# Patient Record
Sex: Female | Born: 1940 | Race: White | Hispanic: No | Marital: Married | Smoking: Former smoker
Health system: Southern US, Community
[De-identification: ages and names within clinical notes are randomized; demographics above are authoritative.]

## PROBLEM LIST (undated history)

## (undated) DIAGNOSIS — I10 Essential (primary) hypertension: Secondary | ICD-10-CM

## (undated) DIAGNOSIS — R911 Solitary pulmonary nodule: Secondary | ICD-10-CM

## (undated) DIAGNOSIS — E039 Hypothyroidism, unspecified: Secondary | ICD-10-CM

## (undated) DIAGNOSIS — E785 Hyperlipidemia, unspecified: Secondary | ICD-10-CM

## (undated) HISTORY — PX: CARDIAC CATHETERIZATION: SHX172

## (undated) HISTORY — PX: CATARACT EXTRACTION: SUR2

## (undated) HISTORY — DX: Solitary pulmonary nodule: R91.1

---

## 1972-04-27 HISTORY — PX: TUBAL LIGATION: SHX77

## 1986-04-27 HISTORY — PX: ABDOMINAL HYSTERECTOMY: SHX81

## 1988-04-27 HISTORY — PX: CHOLECYSTECTOMY: SHX55

## 2002-03-07 ENCOUNTER — Other Ambulatory Visit: Admission: RE | Admit: 2002-03-07 | Discharge: 2002-03-07 | Payer: Self-pay | Admitting: Obstetrics and Gynecology

## 2003-05-22 ENCOUNTER — Encounter: Admission: RE | Admit: 2003-05-22 | Discharge: 2003-05-22 | Payer: Self-pay | Admitting: Emergency Medicine

## 2005-02-23 ENCOUNTER — Ambulatory Visit: Payer: Self-pay | Admitting: Gastroenterology

## 2005-05-08 ENCOUNTER — Encounter: Admission: RE | Admit: 2005-05-08 | Discharge: 2005-05-08 | Payer: Self-pay | Admitting: Emergency Medicine

## 2005-05-14 ENCOUNTER — Encounter: Admission: RE | Admit: 2005-05-14 | Discharge: 2005-05-14 | Payer: Self-pay | Admitting: Emergency Medicine

## 2005-05-22 ENCOUNTER — Ambulatory Visit (HOSPITAL_COMMUNITY): Admission: RE | Admit: 2005-05-22 | Discharge: 2005-05-22 | Payer: Self-pay | Admitting: Emergency Medicine

## 2007-08-05 ENCOUNTER — Encounter: Admission: RE | Admit: 2007-08-05 | Discharge: 2007-08-05 | Payer: Self-pay | Admitting: Emergency Medicine

## 2011-08-05 DIAGNOSIS — I10 Essential (primary) hypertension: Secondary | ICD-10-CM | POA: Diagnosis not present

## 2011-08-05 DIAGNOSIS — E559 Vitamin D deficiency, unspecified: Secondary | ICD-10-CM | POA: Diagnosis not present

## 2011-08-05 DIAGNOSIS — E039 Hypothyroidism, unspecified: Secondary | ICD-10-CM | POA: Diagnosis not present

## 2011-08-05 DIAGNOSIS — E119 Type 2 diabetes mellitus without complications: Secondary | ICD-10-CM | POA: Diagnosis not present

## 2011-08-05 DIAGNOSIS — R252 Cramp and spasm: Secondary | ICD-10-CM | POA: Diagnosis not present

## 2011-08-05 DIAGNOSIS — E785 Hyperlipidemia, unspecified: Secondary | ICD-10-CM | POA: Diagnosis not present

## 2011-08-25 DIAGNOSIS — H25099 Other age-related incipient cataract, unspecified eye: Secondary | ICD-10-CM | POA: Diagnosis not present

## 2011-09-09 DIAGNOSIS — H2510 Age-related nuclear cataract, unspecified eye: Secondary | ICD-10-CM | POA: Diagnosis not present

## 2011-09-10 ENCOUNTER — Encounter (HOSPITAL_COMMUNITY): Payer: Self-pay | Admitting: Physical Medicine and Rehabilitation

## 2011-09-10 ENCOUNTER — Emergency Department (HOSPITAL_COMMUNITY)
Admission: EM | Admit: 2011-09-10 | Discharge: 2011-09-10 | Disposition: A | Discharge status: Home / Self Care | Payer: Medicare Other | Attending: Emergency Medicine | Admitting: Emergency Medicine

## 2011-09-10 DIAGNOSIS — M79609 Pain in unspecified limb: Secondary | ICD-10-CM | POA: Diagnosis not present

## 2011-09-10 DIAGNOSIS — E039 Hypothyroidism, unspecified: Secondary | ICD-10-CM | POA: Diagnosis not present

## 2011-09-10 DIAGNOSIS — R252 Cramp and spasm: Secondary | ICD-10-CM | POA: Diagnosis not present

## 2011-09-10 DIAGNOSIS — M7989 Other specified soft tissue disorders: Secondary | ICD-10-CM

## 2011-09-10 DIAGNOSIS — M545 Low back pain: Secondary | ICD-10-CM | POA: Diagnosis not present

## 2011-09-10 DIAGNOSIS — Z79899 Other long term (current) drug therapy: Secondary | ICD-10-CM | POA: Insufficient documentation

## 2011-09-10 DIAGNOSIS — I10 Essential (primary) hypertension: Secondary | ICD-10-CM | POA: Insufficient documentation

## 2011-09-10 DIAGNOSIS — E119 Type 2 diabetes mellitus without complications: Secondary | ICD-10-CM | POA: Insufficient documentation

## 2011-09-10 DIAGNOSIS — E785 Hyperlipidemia, unspecified: Secondary | ICD-10-CM | POA: Diagnosis not present

## 2011-09-10 HISTORY — DX: Hypothyroidism, unspecified: E03.9

## 2011-09-10 HISTORY — DX: Essential (primary) hypertension: I10

## 2011-09-10 HISTORY — DX: Hyperlipidemia, unspecified: E78.5

## 2011-09-10 LAB — URINALYSIS, ROUTINE W REFLEX MICROSCOPIC
Bilirubin Urine: NEGATIVE
Glucose, UA: NEGATIVE mg/dL
Ketones, ur: NEGATIVE mg/dL
Nitrite: NEGATIVE
Specific Gravity, Urine: 1.009 (ref 1.005–1.030)
pH: 6 (ref 5.0–8.0)

## 2011-09-10 LAB — DIFFERENTIAL
Lymphocytes Relative: 29 % (ref 12–46)
Lymphs Abs: 2 10*3/uL (ref 0.7–4.0)
Monocytes Absolute: 0.6 10*3/uL (ref 0.1–1.0)
Monocytes Relative: 8 % (ref 3–12)
Neutro Abs: 4.2 10*3/uL (ref 1.7–7.7)
Neutrophils Relative %: 61 % (ref 43–77)

## 2011-09-10 LAB — BASIC METABOLIC PANEL
BUN: 17 mg/dL (ref 6–23)
CO2: 27 mEq/L (ref 19–32)
Chloride: 100 mEq/L (ref 96–112)
Creatinine, Ser: 0.95 mg/dL (ref 0.50–1.10)
Potassium: 3.6 mEq/L (ref 3.5–5.1)

## 2011-09-10 LAB — VANCOMYCIN, RANDOM: Vancomycin Rm: <5 ug/mL

## 2011-09-10 LAB — URINE MICROSCOPIC-ADD ON

## 2011-09-10 LAB — CBC
HCT: 41.5 % (ref 36.0–46.0)
Hemoglobin: 14.4 g/dL (ref 12.0–15.0)
RBC: 4.83 MIL/uL (ref 3.87–5.11)
WBC: 7 10*3/uL (ref 4.0–10.5)

## 2011-09-10 MED ORDER — IBUPROFEN 800 MG PO TABS
800.0000 mg | ORAL_TABLET | Freq: Once | ORAL | Status: AC
  Administered 2011-09-10: 800 mg via ORAL
  Filled 2011-09-10: qty 1

## 2011-09-10 MED ORDER — NAPROXEN 500 MG PO TABS: 500.0000 mg | ORAL_TABLET | Freq: Two times a day (BID) | ORAL | Status: AC

## 2011-09-10 NOTE — ED Notes (Signed)
Pt ambulated to bathroom without any difficultly.

## 2011-09-10 NOTE — Discharge Instructions (Signed)
Leg Cramps  Leg cramps that occur during exercise can be caused by poor circulation or dehydration. However, muscle cramps that occur at rest or during the night are usually not due to any serious medical problem. Heat cramps may cause muscle spasms during hot weather.   CAUSES  There is no clear cause for muscle cramps. However, dehydration may be a factor for those who do not drink enough fluids and those who exercise in the heat. Imbalances in the level of sodium, potassium, calcium or magnesium in the muscle tissue may also be a factor. Some medications, such as water pills (diuretics), may cause loss of chemicals that the body needs (like sodium and potassium) and cause muscle cramps.  TREATMENT   · Make sure your diet has enough fluids and essential minerals for the muscle to work normally.  · Avoid strenuous exercise for several days if you have been having frequent leg cramps.  · Stretch and massage the cramped muscle for several minutes.  · Some medicines may be helpful in some patients with night cramps. Only take over-the-counter or prescription medicines as directed by your caregiver.  SEEK IMMEDIATE MEDICAL CARE IF:   · Your leg cramps become worse.  · Your foot becomes cold, numb, or blue.  Document Released: 05/21/2004 Document Revised: 04/02/2011 Document Reviewed: 05/08/2008  Exit[employer]® Patient Information ©2012 [employer].

## 2011-09-10 NOTE — Progress Notes (Signed)
Left lower extremity venous duplex completed.  Preliminary report is negative for DVT, SVT, or a Baker's cyst in the left leg.  Negative for DVT in the right common femoral vein. 

## 2011-09-10 NOTE — ED Notes (Signed)
Pt presents with onset of "cramping" to L thigh that radiates down to her L foot.  Pt reports her husband has been admitted upstairs, she has had no sleep.  Pt also reports x 2-3 days, she has noted dark-colored urine with strong odor.

## 2011-09-10 NOTE — ED Provider Notes (Signed)
History   This chart was scribed for Glynn Octave, MD by Charolett Bumpers . The patient was seen in room STRE7/STRE7.    CSN: [id number]  Arrival date & time 09/10/11  1245   None     Chief Complaint  Patient presents with  . Leg Pain    (Consider location/radiation/quality/duration/timing/severity/associated sxs/prior treatment) HPI Victoria Ferguson is a 71 y.o. female who presents to the Emergency Department complaining of constant, moderate left leg pain that starts in hip that started this morning. Patient describes leg pain as cramping and rates leg pain as a 5/10. Patient states that the leg pain is worsening with weight bearing. Patient denies any recent falls or injuries. Patient denies any numbness/weakness or tingling of extremities. Patient denies any incontinence. Patient also reports associated lower back pain. Patient states that she has taken Advil with some relief. Patient reports a h/o leg pain and cramping in her joints. Patient denies any recent traveling.    Past Medical History  Diagnosis Date  . Hypertension   . Hyperlipemia   . Diabetes mellitus   . Hypothyroidism     No past surgical history on file.  No family history on file.  History  Substance Use Topics  . Smoking status: Never Smoker   . Smokeless tobacco: Not on file  . Alcohol Use: No    OB History    Grav Para Term Preterm Abortions TAB SAB Ect Mult Living                  Review of Systems  Constitutional: Negative for fever.  Gastrointestinal: Negative for vomiting and abdominal pain.  Musculoskeletal: Positive for back pain.       Left leg pain.   All other systems reviewed and are negative.    Allergies  Iohexol  Home Medications   Current Outpatient Rx  Name Route Sig Dispense Refill  . ASPIRIN 325 MG PO TABS Oral Take 325 mg by mouth every 6 (six) hours as needed. For pain    . HYDROCHLOROTHIAZIDE 25 MG PO TABS Oral Take 25 mg by mouth daily.    . IBUPROFEN  200 MG PO TABS Oral Take 200 mg by mouth as needed. For pain    . LEVOTHYROXINE SODIUM 88 MCG PO TABS Oral Take 88 mcg by mouth daily.    . ADULT MULTIVITAMIN W/MINERALS CH Oral Take 1 tablet by mouth daily.    Marland Kitchen POTASSIUM CHLORIDE CRYS ER 20 MEQ PO TBCR Oral Take 20 mEq by mouth daily.    Marland Kitchen SIMVASTATIN 20 MG PO TABS Oral Take 20 mg by mouth every evening.    Marland Kitchen VALSARTAN 160 MG PO TABS Oral Take 160 mg by mouth daily.    Marland Kitchen VITAMIN D (ERGOCALCIFEROL) 50000 UNITS PO CAPS Oral Take 50,000 Units by mouth every 7 (seven) days. Take on sundays      BP 148/67  Pulse 101  Temp(Src) 98.7 F (37.1 C) (Oral)  Resp 18  SpO2 96%  Physical Exam  Nursing note and vitals reviewed. Constitutional: She is oriented to person, place, and time. She appears well-developed and well-nourished. No distress.  HENT:  Head: Normocephalic and atraumatic.  Eyes: EOM are normal.  Neck: Neck supple. No tracheal deviation present.  Cardiovascular: Normal rate, regular rhythm and normal heart sounds.   Pulmonary/Chest: Effort normal and breath sounds normal. No respiratory distress.  Musculoskeletal: Normal range of motion.       Strength 5/5 bilaterally in lower extremities. +  2 DP and TP pulses. No calf assymmetry. No swelling. Able to extend great toes bilaterally. Left perispinal lumbar tenderness.   Neurological: She is alert and oriented to person, place, and time.  Skin: Skin is warm and dry.       Lower extremities are warm and well perfused.   Psychiatric: She has a normal mood and affect. Her behavior is normal.    ED Course  Procedures (including critical care time)  DIAGNOSTIC STUDIES: Oxygen Saturation is 96% on room air, normal by my interpretation.    COORDINATION OF CARE:  1350: Discussed planned course of treatment with the patient, who is agreeable at this time.  1532: Recheck: Reevaluation of patient. Discussed lab results.    Labs Reviewed  CBC - Abnormal; Notable for the following:      Platelets 141 (*)    All other components within normal limits  BASIC METABOLIC PANEL - Abnormal; Notable for the following:    GFR calc non Af Amer 59 (*)    GFR calc Af Amer 69 (*)    All other components within normal limits  URINALYSIS, ROUTINE W REFLEX MICROSCOPIC - Abnormal; Notable for the following:    Leukocytes, UA SMALL (*)    All other components within normal limits  DIFFERENTIAL  CK  VANCOMYCIN, RANDOM  URINE MICROSCOPIC-ADD ON   No results found.   No diagnosis found.    MDM  Left leg pain without history of trauma. Feels cramping sensation and posterior thigh, calf extending to ankle. No weakness, numbness, tingling, bowel or bladder incontinence, fever or chills or vomiting. Has had similar cramping in the past that improves with NSAIDs.  Neurovascular intact, warm well-perfused, easily palpable distal pulses  Doppler negative for DVT. CK within normal limits. No chemistry abnormalities. Pain improved, and ambulatory without assistance.  I personally performed the services described in this documentation, which was scribed in my presence.  The recorded information has been reviewed and considered.       Glynn Octave, MD 09/10/11 [phone]

## 2011-09-10 NOTE — ED Notes (Signed)
Pt presents to department for evaluation of L leg pain. Onset this morning. States pain to L thigh area radiating down leg to foot. Pedal pulses present. Able to wiggle digits. 5/10 pain at the time, becomes worse with weight bearing. She is conscious alert and oriented x4. No signs of acute distress noted at the time. Daughter at bedside.

## 2011-09-14 DIAGNOSIS — H2510 Age-related nuclear cataract, unspecified eye: Secondary | ICD-10-CM | POA: Diagnosis not present

## 2011-09-14 DIAGNOSIS — IMO0002 Reserved for concepts with insufficient information to code with codable children: Secondary | ICD-10-CM | POA: Diagnosis not present

## 2011-09-29 DIAGNOSIS — H2510 Age-related nuclear cataract, unspecified eye: Secondary | ICD-10-CM | POA: Diagnosis not present

## 2011-09-30 DIAGNOSIS — IMO0002 Reserved for concepts with insufficient information to code with codable children: Secondary | ICD-10-CM | POA: Diagnosis not present

## 2011-09-30 DIAGNOSIS — H2510 Age-related nuclear cataract, unspecified eye: Secondary | ICD-10-CM | POA: Diagnosis not present

## 2011-12-22 DIAGNOSIS — E559 Vitamin D deficiency, unspecified: Secondary | ICD-10-CM | POA: Diagnosis not present

## 2011-12-22 DIAGNOSIS — N183 Chronic kidney disease, stage 3 unspecified: Secondary | ICD-10-CM | POA: Diagnosis not present

## 2012-03-29 DIAGNOSIS — E559 Vitamin D deficiency, unspecified: Secondary | ICD-10-CM | POA: Diagnosis not present

## 2012-03-29 DIAGNOSIS — R413 Other amnesia: Secondary | ICD-10-CM | POA: Diagnosis not present

## 2012-03-29 DIAGNOSIS — Z23 Encounter for immunization: Secondary | ICD-10-CM | POA: Diagnosis not present

## 2012-03-29 DIAGNOSIS — E039 Hypothyroidism, unspecified: Secondary | ICD-10-CM | POA: Diagnosis not present

## 2012-03-29 DIAGNOSIS — E119 Type 2 diabetes mellitus without complications: Secondary | ICD-10-CM | POA: Diagnosis not present

## 2012-03-29 DIAGNOSIS — Z0000 Encounter for general adult medical examination without abnormal findings: Secondary | ICD-10-CM | POA: Diagnosis not present

## 2012-03-29 DIAGNOSIS — E785 Hyperlipidemia, unspecified: Secondary | ICD-10-CM | POA: Diagnosis not present

## 2012-03-29 DIAGNOSIS — N183 Chronic kidney disease, stage 3 unspecified: Secondary | ICD-10-CM | POA: Diagnosis not present

## 2012-04-28 DIAGNOSIS — M810 Age-related osteoporosis without current pathological fracture: Secondary | ICD-10-CM | POA: Diagnosis not present

## 2012-04-28 DIAGNOSIS — E8941 Symptomatic postprocedural ovarian failure: Secondary | ICD-10-CM | POA: Diagnosis not present

## 2012-05-02 DIAGNOSIS — I10 Essential (primary) hypertension: Secondary | ICD-10-CM | POA: Diagnosis not present

## 2012-06-01 DIAGNOSIS — M810 Age-related osteoporosis without current pathological fracture: Secondary | ICD-10-CM | POA: Diagnosis not present

## 2012-08-10 DIAGNOSIS — M48061 Spinal stenosis, lumbar region without neurogenic claudication: Secondary | ICD-10-CM | POA: Diagnosis not present

## 2012-08-18 DIAGNOSIS — M47817 Spondylosis without myelopathy or radiculopathy, lumbosacral region: Secondary | ICD-10-CM | POA: Diagnosis not present

## 2012-08-24 DIAGNOSIS — M47817 Spondylosis without myelopathy or radiculopathy, lumbosacral region: Secondary | ICD-10-CM | POA: Diagnosis not present

## 2012-09-01 DIAGNOSIS — M48061 Spinal stenosis, lumbar region without neurogenic claudication: Secondary | ICD-10-CM | POA: Diagnosis not present

## 2012-09-01 DIAGNOSIS — M47817 Spondylosis without myelopathy or radiculopathy, lumbosacral region: Secondary | ICD-10-CM | POA: Diagnosis not present

## 2012-09-06 DIAGNOSIS — M48061 Spinal stenosis, lumbar region without neurogenic claudication: Secondary | ICD-10-CM | POA: Diagnosis not present

## 2012-09-06 DIAGNOSIS — M47817 Spondylosis without myelopathy or radiculopathy, lumbosacral region: Secondary | ICD-10-CM | POA: Diagnosis not present

## 2012-09-09 DIAGNOSIS — M47817 Spondylosis without myelopathy or radiculopathy, lumbosacral region: Secondary | ICD-10-CM | POA: Diagnosis not present

## 2012-09-09 DIAGNOSIS — M48061 Spinal stenosis, lumbar region without neurogenic claudication: Secondary | ICD-10-CM | POA: Diagnosis not present

## 2012-09-12 DIAGNOSIS — M47817 Spondylosis without myelopathy or radiculopathy, lumbosacral region: Secondary | ICD-10-CM | POA: Diagnosis not present

## 2012-09-12 DIAGNOSIS — M48061 Spinal stenosis, lumbar region without neurogenic claudication: Secondary | ICD-10-CM | POA: Diagnosis not present

## 2012-09-14 DIAGNOSIS — M47817 Spondylosis without myelopathy or radiculopathy, lumbosacral region: Secondary | ICD-10-CM | POA: Diagnosis not present

## 2012-09-14 DIAGNOSIS — M48061 Spinal stenosis, lumbar region without neurogenic claudication: Secondary | ICD-10-CM | POA: Diagnosis not present

## 2012-09-20 DIAGNOSIS — M47817 Spondylosis without myelopathy or radiculopathy, lumbosacral region: Secondary | ICD-10-CM | POA: Diagnosis not present

## 2012-09-20 DIAGNOSIS — M48061 Spinal stenosis, lumbar region without neurogenic claudication: Secondary | ICD-10-CM | POA: Diagnosis not present

## 2012-09-26 DIAGNOSIS — M48061 Spinal stenosis, lumbar region without neurogenic claudication: Secondary | ICD-10-CM | POA: Diagnosis not present

## 2012-09-26 DIAGNOSIS — M47817 Spondylosis without myelopathy or radiculopathy, lumbosacral region: Secondary | ICD-10-CM | POA: Diagnosis not present

## 2012-09-27 DIAGNOSIS — R82998 Other abnormal findings in urine: Secondary | ICD-10-CM | POA: Diagnosis not present

## 2012-09-27 DIAGNOSIS — E119 Type 2 diabetes mellitus without complications: Secondary | ICD-10-CM | POA: Diagnosis not present

## 2012-09-27 DIAGNOSIS — E039 Hypothyroidism, unspecified: Secondary | ICD-10-CM | POA: Diagnosis not present

## 2012-09-27 DIAGNOSIS — E785 Hyperlipidemia, unspecified: Secondary | ICD-10-CM | POA: Diagnosis not present

## 2012-09-27 DIAGNOSIS — E559 Vitamin D deficiency, unspecified: Secondary | ICD-10-CM | POA: Diagnosis not present

## 2012-09-27 DIAGNOSIS — Z0000 Encounter for general adult medical examination without abnormal findings: Secondary | ICD-10-CM | POA: Diagnosis not present

## 2012-09-29 DIAGNOSIS — M48061 Spinal stenosis, lumbar region without neurogenic claudication: Secondary | ICD-10-CM | POA: Diagnosis not present

## 2012-09-29 DIAGNOSIS — M47817 Spondylosis without myelopathy or radiculopathy, lumbosacral region: Secondary | ICD-10-CM | POA: Diagnosis not present

## 2012-09-30 DIAGNOSIS — E119 Type 2 diabetes mellitus without complications: Secondary | ICD-10-CM | POA: Diagnosis not present

## 2012-09-30 DIAGNOSIS — E785 Hyperlipidemia, unspecified: Secondary | ICD-10-CM | POA: Diagnosis not present

## 2012-09-30 DIAGNOSIS — I10 Essential (primary) hypertension: Secondary | ICD-10-CM | POA: Diagnosis not present

## 2012-09-30 DIAGNOSIS — R5383 Other fatigue: Secondary | ICD-10-CM | POA: Diagnosis not present

## 2012-09-30 DIAGNOSIS — E039 Hypothyroidism, unspecified: Secondary | ICD-10-CM | POA: Diagnosis not present

## 2012-09-30 DIAGNOSIS — E559 Vitamin D deficiency, unspecified: Secondary | ICD-10-CM | POA: Diagnosis not present

## 2012-09-30 DIAGNOSIS — R5381 Other malaise: Secondary | ICD-10-CM | POA: Diagnosis not present

## 2012-10-04 DIAGNOSIS — M48061 Spinal stenosis, lumbar region without neurogenic claudication: Secondary | ICD-10-CM | POA: Diagnosis not present

## 2012-10-04 DIAGNOSIS — M47817 Spondylosis without myelopathy or radiculopathy, lumbosacral region: Secondary | ICD-10-CM | POA: Diagnosis not present

## 2012-10-06 DIAGNOSIS — M47817 Spondylosis without myelopathy or radiculopathy, lumbosacral region: Secondary | ICD-10-CM | POA: Diagnosis not present

## 2012-10-06 DIAGNOSIS — M48061 Spinal stenosis, lumbar region without neurogenic claudication: Secondary | ICD-10-CM | POA: Diagnosis not present

## 2012-10-11 DIAGNOSIS — M48061 Spinal stenosis, lumbar region without neurogenic claudication: Secondary | ICD-10-CM | POA: Diagnosis not present

## 2012-10-11 DIAGNOSIS — M47817 Spondylosis without myelopathy or radiculopathy, lumbosacral region: Secondary | ICD-10-CM | POA: Diagnosis not present

## 2012-10-13 DIAGNOSIS — M47817 Spondylosis without myelopathy or radiculopathy, lumbosacral region: Secondary | ICD-10-CM | POA: Diagnosis not present

## 2012-10-13 DIAGNOSIS — M48061 Spinal stenosis, lumbar region without neurogenic claudication: Secondary | ICD-10-CM | POA: Diagnosis not present

## 2012-12-16 DIAGNOSIS — M25519 Pain in unspecified shoulder: Secondary | ICD-10-CM | POA: Diagnosis not present

## 2013-01-07 DIAGNOSIS — N3000 Acute cystitis without hematuria: Secondary | ICD-10-CM | POA: Diagnosis not present

## 2013-01-07 DIAGNOSIS — N390 Urinary tract infection, site not specified: Secondary | ICD-10-CM | POA: Diagnosis not present

## 2013-02-06 DIAGNOSIS — M67919 Unspecified disorder of synovium and tendon, unspecified shoulder: Secondary | ICD-10-CM | POA: Diagnosis not present

## 2013-02-06 DIAGNOSIS — M779 Enthesopathy, unspecified: Secondary | ICD-10-CM | POA: Diagnosis not present

## 2013-02-08 DIAGNOSIS — M67919 Unspecified disorder of synovium and tendon, unspecified shoulder: Secondary | ICD-10-CM | POA: Diagnosis not present

## 2013-02-08 DIAGNOSIS — M779 Enthesopathy, unspecified: Secondary | ICD-10-CM | POA: Diagnosis not present

## 2013-02-10 DIAGNOSIS — Z23 Encounter for immunization: Secondary | ICD-10-CM | POA: Diagnosis not present

## 2013-02-14 DIAGNOSIS — M779 Enthesopathy, unspecified: Secondary | ICD-10-CM | POA: Diagnosis not present

## 2013-02-14 DIAGNOSIS — M67919 Unspecified disorder of synovium and tendon, unspecified shoulder: Secondary | ICD-10-CM | POA: Diagnosis not present

## 2013-02-16 DIAGNOSIS — M779 Enthesopathy, unspecified: Secondary | ICD-10-CM | POA: Diagnosis not present

## 2013-02-16 DIAGNOSIS — M67919 Unspecified disorder of synovium and tendon, unspecified shoulder: Secondary | ICD-10-CM | POA: Diagnosis not present

## 2013-02-20 DIAGNOSIS — M67919 Unspecified disorder of synovium and tendon, unspecified shoulder: Secondary | ICD-10-CM | POA: Diagnosis not present

## 2013-02-20 DIAGNOSIS — M779 Enthesopathy, unspecified: Secondary | ICD-10-CM | POA: Diagnosis not present

## 2013-02-22 DIAGNOSIS — M779 Enthesopathy, unspecified: Secondary | ICD-10-CM | POA: Diagnosis not present

## 2013-02-22 DIAGNOSIS — M67919 Unspecified disorder of synovium and tendon, unspecified shoulder: Secondary | ICD-10-CM | POA: Diagnosis not present

## 2013-02-27 DIAGNOSIS — M67919 Unspecified disorder of synovium and tendon, unspecified shoulder: Secondary | ICD-10-CM | POA: Diagnosis not present

## 2013-02-27 DIAGNOSIS — M779 Enthesopathy, unspecified: Secondary | ICD-10-CM | POA: Diagnosis not present

## 2013-03-01 DIAGNOSIS — M779 Enthesopathy, unspecified: Secondary | ICD-10-CM | POA: Diagnosis not present

## 2013-03-01 DIAGNOSIS — M67919 Unspecified disorder of synovium and tendon, unspecified shoulder: Secondary | ICD-10-CM | POA: Diagnosis not present

## 2013-03-06 DIAGNOSIS — M779 Enthesopathy, unspecified: Secondary | ICD-10-CM | POA: Diagnosis not present

## 2013-03-06 DIAGNOSIS — M67919 Unspecified disorder of synovium and tendon, unspecified shoulder: Secondary | ICD-10-CM | POA: Diagnosis not present

## 2013-03-08 DIAGNOSIS — M779 Enthesopathy, unspecified: Secondary | ICD-10-CM | POA: Diagnosis not present

## 2013-03-08 DIAGNOSIS — M67919 Unspecified disorder of synovium and tendon, unspecified shoulder: Secondary | ICD-10-CM | POA: Diagnosis not present

## 2013-03-13 DIAGNOSIS — M779 Enthesopathy, unspecified: Secondary | ICD-10-CM | POA: Diagnosis not present

## 2013-03-13 DIAGNOSIS — M67919 Unspecified disorder of synovium and tendon, unspecified shoulder: Secondary | ICD-10-CM | POA: Diagnosis not present

## 2013-03-15 DIAGNOSIS — M67919 Unspecified disorder of synovium and tendon, unspecified shoulder: Secondary | ICD-10-CM | POA: Diagnosis not present

## 2013-03-15 DIAGNOSIS — M779 Enthesopathy, unspecified: Secondary | ICD-10-CM | POA: Diagnosis not present

## 2013-03-17 DIAGNOSIS — Z79899 Other long term (current) drug therapy: Secondary | ICD-10-CM | POA: Diagnosis not present

## 2013-03-17 DIAGNOSIS — E119 Type 2 diabetes mellitus without complications: Secondary | ICD-10-CM | POA: Diagnosis not present

## 2013-03-17 DIAGNOSIS — E039 Hypothyroidism, unspecified: Secondary | ICD-10-CM | POA: Diagnosis not present

## 2013-03-17 DIAGNOSIS — E559 Vitamin D deficiency, unspecified: Secondary | ICD-10-CM | POA: Diagnosis not present

## 2013-03-21 DIAGNOSIS — I10 Essential (primary) hypertension: Secondary | ICD-10-CM | POA: Diagnosis not present

## 2013-03-21 DIAGNOSIS — E039 Hypothyroidism, unspecified: Secondary | ICD-10-CM | POA: Diagnosis not present

## 2013-03-21 DIAGNOSIS — E559 Vitamin D deficiency, unspecified: Secondary | ICD-10-CM | POA: Diagnosis not present

## 2013-03-21 DIAGNOSIS — E119 Type 2 diabetes mellitus without complications: Secondary | ICD-10-CM | POA: Diagnosis not present

## 2013-03-21 DIAGNOSIS — E785 Hyperlipidemia, unspecified: Secondary | ICD-10-CM | POA: Diagnosis not present

## 2013-03-29 DIAGNOSIS — M67919 Unspecified disorder of synovium and tendon, unspecified shoulder: Secondary | ICD-10-CM | POA: Diagnosis not present

## 2013-03-29 DIAGNOSIS — M779 Enthesopathy, unspecified: Secondary | ICD-10-CM | POA: Diagnosis not present

## 2013-04-05 DIAGNOSIS — M67919 Unspecified disorder of synovium and tendon, unspecified shoulder: Secondary | ICD-10-CM | POA: Diagnosis not present

## 2013-04-05 DIAGNOSIS — M779 Enthesopathy, unspecified: Secondary | ICD-10-CM | POA: Diagnosis not present

## 2013-05-24 DIAGNOSIS — Z23 Encounter for immunization: Secondary | ICD-10-CM | POA: Diagnosis not present

## 2013-05-24 DIAGNOSIS — E119 Type 2 diabetes mellitus without complications: Secondary | ICD-10-CM | POA: Diagnosis not present

## 2013-05-24 DIAGNOSIS — E785 Hyperlipidemia, unspecified: Secondary | ICD-10-CM | POA: Diagnosis not present

## 2013-05-24 DIAGNOSIS — N183 Chronic kidney disease, stage 3 unspecified: Secondary | ICD-10-CM | POA: Diagnosis not present

## 2013-05-24 DIAGNOSIS — I10 Essential (primary) hypertension: Secondary | ICD-10-CM | POA: Diagnosis not present

## 2013-05-24 DIAGNOSIS — E559 Vitamin D deficiency, unspecified: Secondary | ICD-10-CM | POA: Diagnosis not present

## 2013-05-24 DIAGNOSIS — E039 Hypothyroidism, unspecified: Secondary | ICD-10-CM | POA: Diagnosis not present

## 2013-10-16 DIAGNOSIS — Z79899 Other long term (current) drug therapy: Secondary | ICD-10-CM | POA: Diagnosis not present

## 2013-10-16 DIAGNOSIS — E785 Hyperlipidemia, unspecified: Secondary | ICD-10-CM | POA: Diagnosis not present

## 2013-10-16 DIAGNOSIS — E119 Type 2 diabetes mellitus without complications: Secondary | ICD-10-CM | POA: Diagnosis not present

## 2013-10-18 DIAGNOSIS — I10 Essential (primary) hypertension: Secondary | ICD-10-CM | POA: Diagnosis not present

## 2013-10-18 DIAGNOSIS — IMO0001 Reserved for inherently not codable concepts without codable children: Secondary | ICD-10-CM | POA: Diagnosis not present

## 2013-10-18 DIAGNOSIS — E039 Hypothyroidism, unspecified: Secondary | ICD-10-CM | POA: Diagnosis not present

## 2013-10-18 DIAGNOSIS — E119 Type 2 diabetes mellitus without complications: Secondary | ICD-10-CM | POA: Diagnosis not present

## 2013-10-18 DIAGNOSIS — E785 Hyperlipidemia, unspecified: Secondary | ICD-10-CM | POA: Diagnosis not present

## 2013-10-24 DIAGNOSIS — K5900 Constipation, unspecified: Secondary | ICD-10-CM | POA: Diagnosis not present

## 2013-10-24 DIAGNOSIS — Z1211 Encounter for screening for malignant neoplasm of colon: Secondary | ICD-10-CM | POA: Diagnosis not present

## 2014-01-12 DIAGNOSIS — Z23 Encounter for immunization: Secondary | ICD-10-CM | POA: Diagnosis not present

## 2014-01-15 DIAGNOSIS — K6389 Other specified diseases of intestine: Secondary | ICD-10-CM | POA: Diagnosis not present

## 2014-01-15 DIAGNOSIS — D126 Benign neoplasm of colon, unspecified: Secondary | ICD-10-CM | POA: Diagnosis not present

## 2014-01-15 DIAGNOSIS — K5730 Diverticulosis of large intestine without perforation or abscess without bleeding: Secondary | ICD-10-CM | POA: Diagnosis not present

## 2014-01-15 DIAGNOSIS — K5900 Constipation, unspecified: Secondary | ICD-10-CM | POA: Diagnosis not present

## 2014-01-15 DIAGNOSIS — Z1211 Encounter for screening for malignant neoplasm of colon: Secondary | ICD-10-CM | POA: Diagnosis not present

## 2014-01-30 DIAGNOSIS — E119 Type 2 diabetes mellitus without complications: Secondary | ICD-10-CM | POA: Diagnosis not present

## 2014-01-30 DIAGNOSIS — E780 Pure hypercholesterolemia: Secondary | ICD-10-CM | POA: Diagnosis not present

## 2014-01-30 DIAGNOSIS — Z79899 Other long term (current) drug therapy: Secondary | ICD-10-CM | POA: Diagnosis not present

## 2014-01-30 DIAGNOSIS — I10 Essential (primary) hypertension: Secondary | ICD-10-CM | POA: Diagnosis not present

## 2014-02-28 DIAGNOSIS — Z79899 Other long term (current) drug therapy: Secondary | ICD-10-CM | POA: Diagnosis not present

## 2014-05-08 DIAGNOSIS — E119 Type 2 diabetes mellitus without complications: Secondary | ICD-10-CM | POA: Diagnosis not present

## 2014-05-08 DIAGNOSIS — E039 Hypothyroidism, unspecified: Secondary | ICD-10-CM | POA: Diagnosis not present

## 2014-05-08 DIAGNOSIS — Z79899 Other long term (current) drug therapy: Secondary | ICD-10-CM | POA: Diagnosis not present

## 2014-05-08 DIAGNOSIS — R609 Edema, unspecified: Secondary | ICD-10-CM | POA: Diagnosis not present

## 2014-05-08 DIAGNOSIS — R945 Abnormal results of liver function studies: Secondary | ICD-10-CM | POA: Diagnosis not present

## 2014-05-08 DIAGNOSIS — I10 Essential (primary) hypertension: Secondary | ICD-10-CM | POA: Diagnosis not present

## 2014-06-06 DIAGNOSIS — Z79899 Other long term (current) drug therapy: Secondary | ICD-10-CM | POA: Diagnosis not present

## 2014-06-06 DIAGNOSIS — E039 Hypothyroidism, unspecified: Secondary | ICD-10-CM | POA: Diagnosis not present

## 2014-06-18 DIAGNOSIS — R740 Nonspecific elevation of levels of transaminase and lactic acid dehydrogenase [LDH]: Secondary | ICD-10-CM | POA: Diagnosis not present

## 2014-06-18 DIAGNOSIS — R945 Abnormal results of liver function studies: Secondary | ICD-10-CM | POA: Diagnosis not present

## 2014-08-29 DIAGNOSIS — E039 Hypothyroidism, unspecified: Secondary | ICD-10-CM | POA: Diagnosis not present

## 2014-08-29 DIAGNOSIS — E119 Type 2 diabetes mellitus without complications: Secondary | ICD-10-CM | POA: Diagnosis not present

## 2014-08-29 DIAGNOSIS — R7989 Other specified abnormal findings of blood chemistry: Secondary | ICD-10-CM | POA: Diagnosis not present

## 2014-08-29 DIAGNOSIS — E780 Pure hypercholesterolemia: Secondary | ICD-10-CM | POA: Diagnosis not present

## 2014-08-29 DIAGNOSIS — R945 Abnormal results of liver function studies: Secondary | ICD-10-CM | POA: Diagnosis not present

## 2014-09-05 DIAGNOSIS — R7989 Other specified abnormal findings of blood chemistry: Secondary | ICD-10-CM | POA: Diagnosis not present

## 2014-11-22 DIAGNOSIS — Z79899 Other long term (current) drug therapy: Secondary | ICD-10-CM | POA: Diagnosis not present

## 2014-11-22 DIAGNOSIS — N3000 Acute cystitis without hematuria: Secondary | ICD-10-CM | POA: Diagnosis not present

## 2014-11-22 DIAGNOSIS — N390 Urinary tract infection, site not specified: Secondary | ICD-10-CM | POA: Diagnosis not present

## 2015-01-14 ENCOUNTER — Emergency Department (HOSPITAL_COMMUNITY): Payer: Medicare Other

## 2015-01-14 ENCOUNTER — Encounter (HOSPITAL_COMMUNITY): Payer: Self-pay | Admitting: Emergency Medicine

## 2015-01-14 ENCOUNTER — Emergency Department (HOSPITAL_COMMUNITY)
Admission: EM | Admit: 2015-01-14 | Discharge: 2015-01-14 | Disposition: A | Discharge status: Home / Self Care | Payer: Medicare Other | Attending: Emergency Medicine | Admitting: Emergency Medicine

## 2015-01-14 DIAGNOSIS — R079 Chest pain, unspecified: Secondary | ICD-10-CM

## 2015-01-14 DIAGNOSIS — Z79899 Other long term (current) drug therapy: Secondary | ICD-10-CM | POA: Insufficient documentation

## 2015-01-14 DIAGNOSIS — I10 Essential (primary) hypertension: Secondary | ICD-10-CM | POA: Diagnosis not present

## 2015-01-14 DIAGNOSIS — Z87891 Personal history of nicotine dependence: Secondary | ICD-10-CM | POA: Insufficient documentation

## 2015-01-14 DIAGNOSIS — E119 Type 2 diabetes mellitus without complications: Secondary | ICD-10-CM | POA: Insufficient documentation

## 2015-01-14 DIAGNOSIS — E785 Hyperlipidemia, unspecified: Secondary | ICD-10-CM | POA: Insufficient documentation

## 2015-01-14 DIAGNOSIS — E039 Hypothyroidism, unspecified: Secondary | ICD-10-CM | POA: Diagnosis not present

## 2015-01-14 DIAGNOSIS — Z91040 Latex allergy status: Secondary | ICD-10-CM | POA: Diagnosis not present

## 2015-01-14 DIAGNOSIS — Z7982 Long term (current) use of aspirin: Secondary | ICD-10-CM | POA: Insufficient documentation

## 2015-01-14 DIAGNOSIS — R0789 Other chest pain: Secondary | ICD-10-CM | POA: Diagnosis not present

## 2015-01-14 LAB — CBC
HEMATOCRIT: 41.7 % (ref 36.0–46.0)
Hemoglobin: 14.1 g/dL (ref 12.0–15.0)
MCH: 29.6 pg (ref 26.0–34.0)
MCHC: 33.8 g/dL (ref 30.0–36.0)
MCV: 87.4 fL (ref 78.0–100.0)
Platelets: 131 10*3/uL — ABNORMAL LOW (ref 150–400)
RBC: 4.77 MIL/uL (ref 3.87–5.11)
RDW: 13.7 % (ref 11.5–15.5)
WBC: 5.5 10*3/uL (ref 4.0–10.5)

## 2015-01-14 LAB — BASIC METABOLIC PANEL
Anion gap: 8 (ref 5–15)
BUN: 17 mg/dL (ref 6–20)
CHLORIDE: 103 mmol/L (ref 101–111)
CO2: 25 mmol/L (ref 22–32)
Calcium: 9.3 mg/dL (ref 8.9–10.3)
Creatinine, Ser: 0.92 mg/dL (ref 0.44–1.00)
GFR calc Af Amer: >60 mL/min (ref >60)
GFR calc non Af Amer: >60 mL/min (ref >60)
GLUCOSE: 138 mg/dL — AB (ref 65–99)
POTASSIUM: 3.5 mmol/L (ref 3.5–5.1)
Sodium: 136 mmol/L (ref 135–145)

## 2015-01-14 LAB — I-STAT TROPONIN, ED
TROPONIN I, POC: 0.01 ng/mL (ref 0.00–0.08)
Troponin i, poc: 0 ng/mL (ref 0.00–0.08)

## 2015-01-14 MED ORDER — ASPIRIN 81 MG PO CHEW
324.0000 mg | CHEWABLE_TABLET | Freq: Once | ORAL | Status: AC
  Administered 2015-01-14: 324 mg via ORAL
  Filled 2015-01-14: qty 4

## 2015-01-14 NOTE — Discharge Instructions (Signed)
You will be called tomorrow by Cardiology office for a follow up apt. Please follow up as set up by them. If you dont hear from them, please call the number above. Return if worsening symptoms   Chest Pain (Nonspecific) It is often hard to give a specific diagnosis for the cause of chest pain. There is always a chance that your pain could be related to something serious, such as a heart attack or a blood clot in the lungs. You need to follow up with your health care provider for further evaluation. CAUSES   Heartburn.  Pneumonia or bronchitis.  Anxiety or stress.  Inflammation around your heart (pericarditis) or lung (pleuritis or pleurisy).  A blood clot in the lung.  A collapsed lung (pneumothorax). It can develop suddenly on its own (spontaneous pneumothorax) or from trauma to the chest.  Shingles infection (herpes zoster virus). The chest wall is composed of bones, muscles, and cartilage. Any of these can be the source of the pain.  The bones can be bruised by injury.  The muscles or cartilage can be strained by coughing or overwork.  The cartilage can be affected by inflammation and become sore (costochondritis). DIAGNOSIS  Lab tests or other studies may be needed to find the cause of your pain. Your health care provider may have you take a test called an ambulatory electrocardiogram (ECG). An ECG records your heartbeat patterns over a 24-hour period. You may also have other tests, such as:  Transthoracic echocardiogram (TTE). During echocardiography, sound waves are used to evaluate how blood flows through your heart.  Transesophageal echocardiogram (TEE).  Cardiac monitoring. This allows your health care provider to monitor your heart rate and rhythm in real time.  Holter monitor. This is a portable device that records your heartbeat and can help diagnose heart arrhythmias. It allows your health care provider to track your heart activity for several days, if  needed.  Stress tests by exercise or by giving medicine that makes the heart beat faster. TREATMENT   Treatment depends on what may be causing your chest pain. Treatment may include:  Acid blockers for heartburn.  Anti-inflammatory medicine.  Pain medicine for inflammatory conditions.  Antibiotics if an infection is present.  You may be advised to change lifestyle habits. This includes stopping smoking and avoiding alcohol, caffeine, and chocolate.  You may be advised to keep your head raised (elevated) when sleeping. This reduces the chance of acid going backward from your stomach into your esophagus. Most of the time, nonspecific chest pain will improve within 2-3 days with rest and mild pain medicine.  HOME CARE INSTRUCTIONS   If antibiotics were prescribed, take them as directed. Finish them even if you start to feel better.  For the next few days, avoid physical activities that bring on chest pain. Continue physical activities as directed.  Do not use any tobacco products, including cigarettes, chewing tobacco, or electronic cigarettes.  Avoid drinking alcohol.  Only take medicine as directed by your health care provider.  Follow your health care provider's suggestions for further testing if your chest pain does not go away.  Keep any follow-up appointments you made. If you do not go to an appointment, you could develop lasting (chronic) problems with pain. If there is any problem keeping an appointment, call to reschedule. SEEK MEDICAL CARE IF:   Your chest pain does not go away, even after treatment.  You have a rash with blisters on your chest.  You have a fever. SEEK  IMMEDIATE MEDICAL CARE IF:   You have increased chest pain or pain that spreads to your arm, neck, jaw, back, or abdomen.  You have shortness of breath.  You have an increasing cough, or you cough up blood.  You have severe back or abdominal pain.  You feel nauseous or vomit.  You have severe  weakness.  You faint.  You have chills. This is an emergency. Do not wait to see if the pain will go away. Get medical help at once. Call your local emergency services (911 in [place] Do not drive yourself to the hospital. MAKE SURE YOU:   Understand these instructions.  Will watch your condition.  Will get help right away if you are not doing well or get worse. Document Released: 01/21/2005 Document Revised: 04/18/2013 Document Reviewed: 11/17/2007 [facility] Patient Information 2015 [place], [place]. This information is not intended to replace advice given to you by your health care provider. Make sure you discuss any questions you have with your health care provider.

## 2015-01-14 NOTE — ED Notes (Signed)
Pt reports having chest pain around 11:30, lasting around 3-4 minutes.  Pt denies current chest pain.  Pt reports pain at a 2 out 10 in the left should blade and radiating down down left arm.

## 2015-01-14 NOTE — ED Provider Notes (Signed)
CSN: [id number]     Arrival date & time 01/14/15  1250 History   First MD Initiated Contact with Patient 01/14/15 1842     Chief Complaint  Patient presents with  . Chest Pain     (Consider location/radiation/quality/duration/timing/severity/associated sxs/prior Treatment) HPI Victoria Ferguson is a 74 y.o. female with history of hypertension, diabetes, hyperlipidemia, hypothyroidism, presents to emergency department complaining of chest pain. Patient states she was at work when she was talking on the phone and states she started feeling pressure in the left chest. She states she also broke out in sweat. She denies any associated shortness of breath. She states pain radiated from chest to the shoulder or left arm. States she decided to go to emergency department. Patient states on the way to the hospital she drank some water and states her pain went away. She denies any history of any chest problems in the past. She denies history of coronary artery disease. He denies history of stress tests. She denies any history of lung problems. She states that her father died from MI at age 43 and states that her brother has CHF. She states she is currently pain-free. Denies increased swelling or pain in her legs. Denies headache, abdominal pain, nausea, vomiting.  Past Medical History  Diagnosis Date  . Hypertension   . Hyperlipemia   . Diabetes mellitus   . Hypothyroidism    Past Surgical History  Procedure Laterality Date  . Tubal ligation  1974  . Abdominal hysterectomy  1988  . Cholecystectomy  1990  . Cataract extraction Bilateral    Family History  Problem Relation Age of Onset  . Heart failure Mother   . Heart failure Father    Social History  Substance Use Topics  . Smoking status: Former Smoker    Types: Cigarettes  . Smokeless tobacco: None  . Alcohol Use: No   OB History    No data available     Review of Systems  Constitutional: Positive for diaphoresis. Negative for fever  and chills.  Respiratory: Negative for cough, chest tightness and shortness of breath.   Cardiovascular: Positive for chest pain. Negative for palpitations and leg swelling.  Gastrointestinal: Negative for nausea, vomiting, abdominal pain and diarrhea.  Genitourinary: Negative for dysuria, flank pain, vaginal bleeding, vaginal discharge, vaginal pain and pelvic pain.  Musculoskeletal: Negative for myalgias, arthralgias, neck pain and neck stiffness.  Skin: Negative for rash.  Neurological: Negative for dizziness, weakness and headaches.  All other systems reviewed and are negative.     Allergies  Iohexol and Latex  Home Medications   Prior to Admission medications   Medication Sig Start Date End Date Taking? Authorizing Provider  aspirin 325 MG tablet Take 325 mg by mouth every 6 (six) hours as needed. For pain    Historical Provider, MD  hydrochlorothiazide (HYDRODIURIL) 25 MG tablet Take 25 mg by mouth daily.    Historical Provider, MD  ibuprofen (ADVIL,MOTRIN) 200 MG tablet Take 200 mg by mouth as needed. For pain    Historical Provider, MD  levothyroxine (SYNTHROID, LEVOTHROID) 88 MCG tablet Take 88 mcg by mouth daily.    Historical Provider, MD  Multiple Vitamin (MULITIVITAMIN WITH MINERALS) TABS Take 1 tablet by mouth daily.    Historical Provider, MD  potassium chloride SA (K-DUR,KLOR-CON) 20 MEQ tablet Take 20 mEq by mouth daily.    Historical Provider, MD  simvastatin (ZOCOR) 20 MG tablet Take 20 mg by mouth every evening.    Historical Provider,  MD  valsartan (DIOVAN) 160 MG tablet Take 160 mg by mouth daily.    Historical Provider, MD  Vitamin D, Ergocalciferol, (DRISDOL) 50000 UNITS CAPS Take 50,000 Units by mouth every 7 (seven) days. Take on sundays    Historical Provider, MD   BP 123/69 mmHg  Pulse 85  Temp(Src) 98.2 F (36.8 C) (Oral)  Resp 19  Ht 5\' 1"  (1.549 m)  Wt 175 lb (79.379 kg)  BMI 33.08 kg/m2  SpO2 95% Physical Exam  Constitutional: She is oriented to  person, place, and time. She appears well-developed and well-nourished. No distress.  HENT:  Head: Normocephalic.  Eyes: Conjunctivae are normal.  Neck: Neck supple.  Cardiovascular: Normal rate, regular rhythm and normal heart sounds.   Pulmonary/Chest: Effort normal and breath sounds normal. No respiratory distress. She has no wheezes. She has no rales. She exhibits no tenderness.  Abdominal: Soft. Bowel sounds are normal. She exhibits no distension. There is no tenderness. There is no rebound.  Musculoskeletal: She exhibits no edema.  Neurological: She is alert and oriented to person, place, and time.  Skin: Skin is warm and dry.  Psychiatric: She has a normal mood and affect. Her behavior is normal.  Nursing note and vitals reviewed.   ED Course  Procedures (including critical care time) Labs Review Labs Reviewed  BASIC METABOLIC PANEL - Abnormal; Notable for the following:    Glucose, Bld 138 (*)    All other components within normal limits  CBC - Abnormal; Notable for the following:    Platelets 131 (*)    All other components within normal limits  I-STAT TROPOININ, ED  Randolm Idol, ED    Imaging Review Dg Chest 2 View  01/14/2015   CLINICAL DATA:  Mid chest pain,sob  EXAM: CHEST  2 VIEW  COMPARISON:  None.  FINDINGS: The heart size and mediastinal contours are within normal limits. Lungs are mildly hyperexpanded with flattened hemidiaphragms. Mild scarring is noted in the anterior lung bases. No lung consolidation or edema. No pleural effusion or pneumothorax.  Bony thorax is demineralized but grossly intact.  IMPRESSION: No acute cardiopulmonary disease.   Electronically Signed   By: Lajean Manes M.D.   On: 01/14/2015 13:36   I have personally reviewed and evaluated these images and lab results as part of my medical decision-making.   EKG Interpretation   Date/Time:  Monday January 14 2015 12:54:45 EDT Ventricular Rate:  89 PR Interval:  146 QRS Duration:  96 QT Interval:  378 QTC Calculation: 459 R Axis:   -66 Text Interpretation:  Normal sinus rhythm Left axis deviation Low voltage  QRS Incomplete right bundle branch block Abnormal ECG No old tracing to  compare Confirmed by Debby Freiberg [phone]) on 01/14/2015 7:29:44 PM      MDM   Final diagnoses:  Chest pain, unspecified chest pain type    patient in the emergency department episode of chest pain, only lasting several minutes. Currently chest pain-free. Associated diaphoresis, no other symptoms. Pain resolved with drinking water. Vital signs are normal. Will get labs, chest x-ray.  8:26 PM Patient has some risk factors for coronary disease, she denies any history of disease herself. Her heart score is 4. She has been asymptomatic entire time in emergency department. Six-hour delta troponin is negative. EKG with no acute evidence of ischemia. Discussed with Dr. Colin Rhein, will discharge home with close outpatient follow-up. I called cardiology, will set her up an appointment and will give her call tomorrow with time  and day. I discussed return precautions with her. She was understanding. Pain free at time of discharge.   Filed Vitals:   01/14/15 1256 01/14/15 1833 01/14/15 1855  BP: 139/78 120/67 123/69  Pulse: 86 85 85  Temp: 97.3 F (36.3 C) 97.8 F (36.6 C) 98.2 F (36.8 C)  TempSrc: Oral Oral Oral  Resp: 18 17 19   Height: 5\' 1"  (1.549 m)    Weight: 175 lb (79.379 kg)    SpO2: 96% 99% 95%     Jeannett Senior, PA-C 01/14/15 2239  Debby Freiberg, MD 01/17/15 [phone]

## 2015-01-14 NOTE — ED Notes (Signed)
Patient states chest pain that started 20 minutes ago in left chest, but has resolved at this time.   Patient states had some diaphoresis with the pain, but denies other symptoms.  Patient states she drank some water on her way here and was okay.  Patient states didn't take anything for the pain.   Patient states she has had a cold lately.

## 2015-01-31 DIAGNOSIS — I10 Essential (primary) hypertension: Secondary | ICD-10-CM | POA: Insufficient documentation

## 2015-01-31 DIAGNOSIS — E785 Hyperlipidemia, unspecified: Secondary | ICD-10-CM | POA: Insufficient documentation

## 2015-01-31 DIAGNOSIS — E039 Hypothyroidism, unspecified: Secondary | ICD-10-CM | POA: Insufficient documentation

## 2015-02-01 DIAGNOSIS — Z23 Encounter for immunization: Secondary | ICD-10-CM | POA: Diagnosis not present

## 2015-02-01 DIAGNOSIS — R079 Chest pain, unspecified: Secondary | ICD-10-CM | POA: Diagnosis not present

## 2015-02-01 DIAGNOSIS — E119 Type 2 diabetes mellitus without complications: Secondary | ICD-10-CM | POA: Diagnosis not present

## 2015-02-01 DIAGNOSIS — I10 Essential (primary) hypertension: Secondary | ICD-10-CM | POA: Diagnosis not present

## 2015-02-01 DIAGNOSIS — M722 Plantar fascial fibromatosis: Secondary | ICD-10-CM | POA: Diagnosis not present

## 2015-02-01 DIAGNOSIS — E7800 Pure hypercholesterolemia, unspecified: Secondary | ICD-10-CM | POA: Diagnosis not present

## 2015-02-01 NOTE — Progress Notes (Signed)
HPI: 74 year old female for evaluation of chest pain. No prior cardiac history. In September the patient had an episode of chest pain while at work. It was in the left upper chest and described as an ache. Some radiation to her left shoulder. Mild diaphoresis but no dyspnea or nausea. The pain was not pleuritic or positional. She drank water and the pain resolved. Total duration 5 minutes. She does have dyspnea on exertion but no orthopnea, PND, pedal edema, exertional chest pain or syncope. She was seen in the emergency room and enzymes negative and chest x-ray with no infiltrate. Hemoglobin normal.  Current Outpatient Prescriptions  Medication Sig Dispense Refill  . glipiZIDE (GLUCOTROL) 5 MG tablet Take 5 mg by mouth daily before breakfast.    . hydrochlorothiazide (HYDRODIURIL) 25 MG tablet Take 25 mg by mouth daily.    Marland Kitchen ibuprofen (ADVIL,MOTRIN) 200 MG tablet Take 200 mg by mouth as needed for moderate pain. For pain    . levothyroxine (SYNTHROID, LEVOTHROID) 88 MCG tablet Take 88 mcg by mouth daily.    . Omega-3 Fatty Acids (OMEGA 3 PO) Take 1 capsule by mouth daily.    . potassium chloride SA (K-DUR,KLOR-CON) 20 MEQ tablet Take 20 mEq by mouth daily.    . simvastatin (ZOCOR) 20 MG tablet Take 20 mg by mouth every evening.    . valsartan (DIOVAN) 160 MG tablet Take 160 mg by mouth daily.     No current facility-administered medications for this visit.    Allergies  Allergen Reactions  . Iohexol Hives     Desc: HIVES W/ IV CONTRAST 20 YRS AGO/A.C.PRE-MED W/ PREDNISONE AND BENADRYAL [place]-05/14/05   Code: HIVES, Desc: HIVES FROM IV DYE IN 1988   . Latex Itching and Rash     Past Medical History  Diagnosis Date  . Hypertension   . Hyperlipemia   . Diabetes mellitus   . Hypothyroidism   . Lung nodule     Past Surgical History  Procedure Laterality Date  . Tubal ligation  1974  . Abdominal hysterectomy  1988  . Cholecystectomy  1990  . Cataract extraction Bilateral      Social History   Social History  . Marital Status: Married    Spouse Name: N/A  . Number of Children: 4  . Years of Education: N/A   Occupational History  . Not on file.   Social History Main Topics  . Smoking status: Former Smoker    Types: Cigarettes  . Smokeless tobacco: Not on file  . Alcohol Use: 0.0 oz/week    0 Standard drinks or equivalent per week     Comment: Wine occasionally  . Drug Use: No  . Sexual Activity: Not on file   Other Topics Concern  . Not on file   Social History Narrative    Family History  Problem Relation Age of Onset  . Heart failure Mother   . Heart failure Father   . CAD Father     Died at age 42 of MI    ROS: Recent cold but no fevers or chills, productive cough, hemoptysis, dysphasia, odynophagia, melena, hematochezia, dysuria, hematuria, rash, seizure activity, orthopnea, PND, pedal edema, claudication. Remaining systems are negative.  Physical Exam:   Blood pressure 140/78, pulse 88, height 5\' 1"  (1.549 m), weight 81.647 kg (180 lb).  General:  Well developed/obese in NAD Skin warm/dry Patient not depressed No peripheral clubbing Back-normal HEENT-normal/normal eyelids Neck supple/normal carotid upstroke bilaterally; no bruits; no JVD;  no thyromegaly chest - CTA/ normal expansion CV - RRR/normal S1 and S2; no murmurs, rubs or gallops;  PMI nondisplaced Abdomen -NT/ND, no HSM, no mass, + bowel sounds, no bruit 2+ femoral pulses, no bruits Ext-no edema, chords, 2+ DP Neuro-grossly nonfocal  ECG 01/14/2015-sinus rhythm, incomplete right bundle branch block, no ST changes.

## 2015-02-05 ENCOUNTER — Ambulatory Visit (INDEPENDENT_AMBULATORY_CARE_PROVIDER_SITE_OTHER): Payer: Medicare Other | Admitting: Cardiology

## 2015-02-05 ENCOUNTER — Encounter: Payer: Self-pay | Admitting: Cardiology

## 2015-02-05 ENCOUNTER — Encounter: Payer: Self-pay | Admitting: *Deleted

## 2015-02-05 VITALS — BP 140/78 | HR 88 | Ht 61.0 in | Wt 180.0 lb

## 2015-02-05 DIAGNOSIS — R079 Chest pain, unspecified: Secondary | ICD-10-CM

## 2015-02-05 DIAGNOSIS — R072 Precordial pain: Secondary | ICD-10-CM | POA: Diagnosis not present

## 2015-02-05 DIAGNOSIS — E785 Hyperlipidemia, unspecified: Secondary | ICD-10-CM | POA: Diagnosis not present

## 2015-02-05 DIAGNOSIS — I10 Essential (primary) hypertension: Secondary | ICD-10-CM | POA: Diagnosis not present

## 2015-02-05 NOTE — Assessment & Plan Note (Signed)
Symptoms somewhat atypical but multiple risk factors. Plan Lexiscan nuclear study for risk stratification. Patient with significant arthralgias and will not be able to ambulate on the treadmill.

## 2015-02-05 NOTE — Patient Instructions (Signed)
Your physician recommends that you schedule a follow-up appointment in:  AS NEEDED PENDING TEST RESULTS  Your physician has requested that you have a lexiscan myoview. For further information please visit [email or web address]. Please follow instruction sheet, as given.   

## 2015-02-05 NOTE — Assessment & Plan Note (Signed)
Blood pressure controlled. Continue present medications. 

## 2015-02-05 NOTE — Assessment & Plan Note (Signed)
Continue statin. 

## 2015-02-05 NOTE — Addendum Note (Signed)
Addended by: Cristopher Estimable on: 02/05/2015 11:54 AM   Modules accepted: Orders

## 2015-02-14 ENCOUNTER — Telehealth (HOSPITAL_COMMUNITY): Payer: Self-pay

## 2015-02-14 NOTE — Telephone Encounter (Signed)
Encounter complete. 

## 2015-02-15 ENCOUNTER — Ambulatory Visit (HOSPITAL_COMMUNITY)
Admission: RE | Admit: 2015-02-15 | Discharge: 2015-02-15 | Disposition: A | Discharge status: Home / Self Care | Payer: Medicare Other | Source: Ambulatory Visit | Attending: Internal Medicine | Admitting: Internal Medicine

## 2015-02-15 DIAGNOSIS — E119 Type 2 diabetes mellitus without complications: Secondary | ICD-10-CM | POA: Insufficient documentation

## 2015-02-15 DIAGNOSIS — R0609 Other forms of dyspnea: Secondary | ICD-10-CM | POA: Diagnosis not present

## 2015-02-15 DIAGNOSIS — E785 Hyperlipidemia, unspecified: Secondary | ICD-10-CM | POA: Insufficient documentation

## 2015-02-15 DIAGNOSIS — Z6834 Body mass index (BMI) 34.0-34.9, adult: Secondary | ICD-10-CM | POA: Diagnosis not present

## 2015-02-15 DIAGNOSIS — Z8249 Family history of ischemic heart disease and other diseases of the circulatory system: Secondary | ICD-10-CM | POA: Diagnosis not present

## 2015-02-15 DIAGNOSIS — I4510 Unspecified right bundle-branch block: Secondary | ICD-10-CM | POA: Diagnosis not present

## 2015-02-15 DIAGNOSIS — I10 Essential (primary) hypertension: Secondary | ICD-10-CM | POA: Diagnosis not present

## 2015-02-15 DIAGNOSIS — Z87891 Personal history of nicotine dependence: Secondary | ICD-10-CM | POA: Insufficient documentation

## 2015-02-15 DIAGNOSIS — R079 Chest pain, unspecified: Secondary | ICD-10-CM | POA: Insufficient documentation

## 2015-02-15 DIAGNOSIS — R42 Dizziness and giddiness: Secondary | ICD-10-CM | POA: Insufficient documentation

## 2015-02-15 DIAGNOSIS — E669 Obesity, unspecified: Secondary | ICD-10-CM | POA: Diagnosis not present

## 2015-02-15 LAB — MYOCARDIAL PERFUSION IMAGING
CHL CUP NUCLEAR SDS: 5
CHL CUP NUCLEAR SRS: 8
CHL CUP RESTING HR STRESS: 78 {beats}/min
LV dias vol: 28 mL
LV sys vol: 5 mL
Peak HR: 115 {beats}/min
SSS: 11
TID: 0.83

## 2015-02-15 MED ORDER — TECHNETIUM TC 99M SESTAMIBI GENERIC - CARDIOLITE
32.2000 | Freq: Once | INTRAVENOUS | Status: AC | PRN
  Administered 2015-02-15: 32.2 via INTRAVENOUS

## 2015-02-15 MED ORDER — REGADENOSON 0.4 MG/5ML IV SOLN
0.4000 mg | Freq: Once | INTRAVENOUS | Status: AC
  Administered 2015-02-15: 0.4 mg via INTRAVENOUS

## 2015-02-15 MED ORDER — TECHNETIUM TC 99M SESTAMIBI GENERIC - CARDIOLITE
10.8000 | Freq: Once | INTRAVENOUS | Status: AC | PRN
Start: 2015-02-15 — End: 2015-02-15
  Administered 2015-02-15: 10.8 via INTRAVENOUS

## 2015-04-03 DIAGNOSIS — M79604 Pain in right leg: Secondary | ICD-10-CM | POA: Diagnosis not present

## 2015-04-03 DIAGNOSIS — Z7984 Long term (current) use of oral hypoglycemic drugs: Secondary | ICD-10-CM | POA: Diagnosis not present

## 2015-04-03 DIAGNOSIS — E1165 Type 2 diabetes mellitus with hyperglycemia: Secondary | ICD-10-CM | POA: Diagnosis not present

## 2015-04-03 DIAGNOSIS — E7800 Pure hypercholesterolemia, unspecified: Secondary | ICD-10-CM | POA: Diagnosis not present

## 2015-04-03 DIAGNOSIS — Z79899 Other long term (current) drug therapy: Secondary | ICD-10-CM | POA: Diagnosis not present

## 2015-04-03 DIAGNOSIS — E119 Type 2 diabetes mellitus without complications: Secondary | ICD-10-CM | POA: Diagnosis not present

## 2015-04-10 DIAGNOSIS — M79604 Pain in right leg: Secondary | ICD-10-CM | POA: Diagnosis not present

## 2015-05-23 DIAGNOSIS — K136 Irritative hyperplasia of oral mucosa: Secondary | ICD-10-CM | POA: Diagnosis not present

## 2015-07-26 DIAGNOSIS — E039 Hypothyroidism, unspecified: Secondary | ICD-10-CM | POA: Diagnosis not present

## 2015-07-26 DIAGNOSIS — E119 Type 2 diabetes mellitus without complications: Secondary | ICD-10-CM | POA: Diagnosis not present

## 2015-07-26 DIAGNOSIS — Z9109 Other allergy status, other than to drugs and biological substances: Secondary | ICD-10-CM | POA: Diagnosis not present

## 2015-07-26 DIAGNOSIS — E785 Hyperlipidemia, unspecified: Secondary | ICD-10-CM | POA: Diagnosis not present

## 2015-07-26 DIAGNOSIS — N390 Urinary tract infection, site not specified: Secondary | ICD-10-CM | POA: Diagnosis not present

## 2015-07-26 DIAGNOSIS — F419 Anxiety disorder, unspecified: Secondary | ICD-10-CM | POA: Diagnosis not present

## 2015-07-26 DIAGNOSIS — I10 Essential (primary) hypertension: Secondary | ICD-10-CM | POA: Diagnosis not present

## 2015-07-26 DIAGNOSIS — Z79899 Other long term (current) drug therapy: Secondary | ICD-10-CM | POA: Diagnosis not present

## 2015-07-26 DIAGNOSIS — E7800 Pure hypercholesterolemia, unspecified: Secondary | ICD-10-CM | POA: Diagnosis not present

## 2015-11-27 DIAGNOSIS — S46119A Strain of muscle, fascia and tendon of long head of biceps, unspecified arm, initial encounter: Secondary | ICD-10-CM | POA: Diagnosis not present

## 2015-11-27 DIAGNOSIS — Y93K9 Activity, other involving animal care: Secondary | ICD-10-CM | POA: Diagnosis not present

## 2016-01-08 DIAGNOSIS — I10 Essential (primary) hypertension: Secondary | ICD-10-CM | POA: Diagnosis not present

## 2016-01-08 DIAGNOSIS — E785 Hyperlipidemia, unspecified: Secondary | ICD-10-CM | POA: Diagnosis not present

## 2016-01-08 DIAGNOSIS — M2550 Pain in unspecified joint: Secondary | ICD-10-CM | POA: Diagnosis not present

## 2016-01-08 DIAGNOSIS — E139 Other specified diabetes mellitus without complications: Secondary | ICD-10-CM | POA: Diagnosis not present

## 2016-01-08 DIAGNOSIS — L719 Rosacea, unspecified: Secondary | ICD-10-CM | POA: Diagnosis not present

## 2016-01-08 DIAGNOSIS — E039 Hypothyroidism, unspecified: Secondary | ICD-10-CM | POA: Diagnosis not present

## 2016-03-06 DIAGNOSIS — B9789 Other viral agents as the cause of diseases classified elsewhere: Secondary | ICD-10-CM | POA: Diagnosis not present

## 2016-03-06 DIAGNOSIS — J069 Acute upper respiratory infection, unspecified: Secondary | ICD-10-CM | POA: Diagnosis not present

## 2016-04-01 DIAGNOSIS — Z23 Encounter for immunization: Secondary | ICD-10-CM | POA: Diagnosis not present

## 2016-04-01 DIAGNOSIS — E1165 Type 2 diabetes mellitus with hyperglycemia: Secondary | ICD-10-CM | POA: Diagnosis not present

## 2016-04-01 DIAGNOSIS — Z7984 Long term (current) use of oral hypoglycemic drugs: Secondary | ICD-10-CM | POA: Diagnosis not present

## 2016-04-01 DIAGNOSIS — E039 Hypothyroidism, unspecified: Secondary | ICD-10-CM | POA: Diagnosis not present

## 2016-04-01 DIAGNOSIS — E785 Hyperlipidemia, unspecified: Secondary | ICD-10-CM | POA: Diagnosis not present

## 2016-04-01 DIAGNOSIS — E119 Type 2 diabetes mellitus without complications: Secondary | ICD-10-CM | POA: Diagnosis not present

## 2016-04-01 DIAGNOSIS — J0100 Acute maxillary sinusitis, unspecified: Secondary | ICD-10-CM | POA: Diagnosis not present

## 2016-04-01 DIAGNOSIS — I10 Essential (primary) hypertension: Secondary | ICD-10-CM | POA: Diagnosis not present

## 2016-04-01 DIAGNOSIS — Z79899 Other long term (current) drug therapy: Secondary | ICD-10-CM | POA: Diagnosis not present

## 2016-06-30 DIAGNOSIS — E119 Type 2 diabetes mellitus without complications: Secondary | ICD-10-CM | POA: Diagnosis not present

## 2016-06-30 DIAGNOSIS — Z7984 Long term (current) use of oral hypoglycemic drugs: Secondary | ICD-10-CM | POA: Diagnosis not present

## 2016-06-30 DIAGNOSIS — I10 Essential (primary) hypertension: Secondary | ICD-10-CM | POA: Diagnosis not present

## 2016-08-17 DIAGNOSIS — M810 Age-related osteoporosis without current pathological fracture: Secondary | ICD-10-CM | POA: Diagnosis not present

## 2016-09-11 DIAGNOSIS — M810 Age-related osteoporosis without current pathological fracture: Secondary | ICD-10-CM | POA: Diagnosis not present

## 2016-10-06 DIAGNOSIS — M79672 Pain in left foot: Secondary | ICD-10-CM | POA: Diagnosis not present

## 2016-10-06 DIAGNOSIS — M79671 Pain in right foot: Secondary | ICD-10-CM | POA: Diagnosis not present

## 2016-10-06 DIAGNOSIS — Z7984 Long term (current) use of oral hypoglycemic drugs: Secondary | ICD-10-CM | POA: Diagnosis not present

## 2016-10-06 DIAGNOSIS — E119 Type 2 diabetes mellitus without complications: Secondary | ICD-10-CM | POA: Diagnosis not present

## 2016-10-06 DIAGNOSIS — I10 Essential (primary) hypertension: Secondary | ICD-10-CM | POA: Diagnosis not present

## 2016-10-06 DIAGNOSIS — H9193 Unspecified hearing loss, bilateral: Secondary | ICD-10-CM | POA: Diagnosis not present

## 2016-10-06 DIAGNOSIS — L989 Disorder of the skin and subcutaneous tissue, unspecified: Secondary | ICD-10-CM | POA: Diagnosis not present

## 2017-03-08 DIAGNOSIS — H9113 Presbycusis, bilateral: Secondary | ICD-10-CM | POA: Diagnosis not present

## 2017-03-08 DIAGNOSIS — H903 Sensorineural hearing loss, bilateral: Secondary | ICD-10-CM | POA: Diagnosis not present

## 2017-03-23 DIAGNOSIS — Z23 Encounter for immunization: Secondary | ICD-10-CM | POA: Diagnosis not present

## 2017-06-02 DIAGNOSIS — E039 Hypothyroidism, unspecified: Secondary | ICD-10-CM | POA: Diagnosis not present

## 2017-06-02 DIAGNOSIS — E119 Type 2 diabetes mellitus without complications: Secondary | ICD-10-CM | POA: Diagnosis not present

## 2017-06-02 DIAGNOSIS — I10 Essential (primary) hypertension: Secondary | ICD-10-CM | POA: Diagnosis not present

## 2017-06-02 DIAGNOSIS — Z7984 Long term (current) use of oral hypoglycemic drugs: Secondary | ICD-10-CM | POA: Diagnosis not present

## 2017-06-02 DIAGNOSIS — R5383 Other fatigue: Secondary | ICD-10-CM | POA: Diagnosis not present

## 2017-07-19 DIAGNOSIS — R350 Frequency of micturition: Secondary | ICD-10-CM | POA: Diagnosis not present

## 2017-07-19 DIAGNOSIS — N390 Urinary tract infection, site not specified: Secondary | ICD-10-CM | POA: Diagnosis not present

## 2017-07-19 DIAGNOSIS — L819 Disorder of pigmentation, unspecified: Secondary | ICD-10-CM | POA: Diagnosis not present

## 2017-08-16 DIAGNOSIS — C44519 Basal cell carcinoma of skin of other part of trunk: Secondary | ICD-10-CM | POA: Diagnosis not present

## 2017-08-16 DIAGNOSIS — C44219 Basal cell carcinoma of skin of left ear and external auricular canal: Secondary | ICD-10-CM | POA: Diagnosis not present

## 2017-09-01 DIAGNOSIS — I10 Essential (primary) hypertension: Secondary | ICD-10-CM | POA: Diagnosis not present

## 2017-09-01 DIAGNOSIS — E559 Vitamin D deficiency, unspecified: Secondary | ICD-10-CM | POA: Diagnosis not present

## 2017-09-01 DIAGNOSIS — E119 Type 2 diabetes mellitus without complications: Secondary | ICD-10-CM | POA: Diagnosis not present

## 2017-09-01 DIAGNOSIS — E785 Hyperlipidemia, unspecified: Secondary | ICD-10-CM | POA: Diagnosis not present

## 2017-09-01 DIAGNOSIS — Z7984 Long term (current) use of oral hypoglycemic drugs: Secondary | ICD-10-CM | POA: Diagnosis not present

## 2017-09-01 DIAGNOSIS — N390 Urinary tract infection, site not specified: Secondary | ICD-10-CM | POA: Diagnosis not present

## 2017-09-01 DIAGNOSIS — E039 Hypothyroidism, unspecified: Secondary | ICD-10-CM | POA: Diagnosis not present

## 2017-09-01 DIAGNOSIS — Z9109 Other allergy status, other than to drugs and biological substances: Secondary | ICD-10-CM | POA: Diagnosis not present

## 2017-09-01 DIAGNOSIS — Z23 Encounter for immunization: Secondary | ICD-10-CM | POA: Diagnosis not present

## 2017-09-01 DIAGNOSIS — K219 Gastro-esophageal reflux disease without esophagitis: Secondary | ICD-10-CM | POA: Diagnosis not present

## 2017-09-07 DIAGNOSIS — Z1231 Encounter for screening mammogram for malignant neoplasm of breast: Secondary | ICD-10-CM | POA: Diagnosis not present

## 2017-09-07 DIAGNOSIS — Z01419 Encounter for gynecological examination (general) (routine) without abnormal findings: Secondary | ICD-10-CM | POA: Diagnosis not present

## 2017-09-07 DIAGNOSIS — Z6831 Body mass index (BMI) 31.0-31.9, adult: Secondary | ICD-10-CM | POA: Diagnosis not present

## 2017-11-03 DIAGNOSIS — C44219 Basal cell carcinoma of skin of left ear and external auricular canal: Secondary | ICD-10-CM | POA: Diagnosis not present

## 2017-12-09 DIAGNOSIS — C44519 Basal cell carcinoma of skin of other part of trunk: Secondary | ICD-10-CM | POA: Diagnosis not present

## 2018-03-08 DIAGNOSIS — R300 Dysuria: Secondary | ICD-10-CM | POA: Diagnosis not present

## 2018-03-08 DIAGNOSIS — E785 Hyperlipidemia, unspecified: Secondary | ICD-10-CM | POA: Diagnosis not present

## 2018-03-08 DIAGNOSIS — I10 Essential (primary) hypertension: Secondary | ICD-10-CM | POA: Diagnosis not present

## 2018-03-08 DIAGNOSIS — E559 Vitamin D deficiency, unspecified: Secondary | ICD-10-CM | POA: Diagnosis not present

## 2018-03-08 DIAGNOSIS — E039 Hypothyroidism, unspecified: Secondary | ICD-10-CM | POA: Diagnosis not present

## 2018-03-08 DIAGNOSIS — Z23 Encounter for immunization: Secondary | ICD-10-CM | POA: Diagnosis not present

## 2018-03-08 DIAGNOSIS — E119 Type 2 diabetes mellitus without complications: Secondary | ICD-10-CM | POA: Diagnosis not present

## 2018-04-08 DIAGNOSIS — N390 Urinary tract infection, site not specified: Secondary | ICD-10-CM | POA: Diagnosis not present

## 2018-04-25 DIAGNOSIS — H43813 Vitreous degeneration, bilateral: Secondary | ICD-10-CM | POA: Diagnosis not present

## 2018-04-25 DIAGNOSIS — E119 Type 2 diabetes mellitus without complications: Secondary | ICD-10-CM | POA: Diagnosis not present

## 2018-04-25 DIAGNOSIS — Z961 Presence of intraocular lens: Secondary | ICD-10-CM | POA: Diagnosis not present

## 2018-04-25 DIAGNOSIS — H31093 Other chorioretinal scars, bilateral: Secondary | ICD-10-CM | POA: Diagnosis not present

## 2018-05-04 DIAGNOSIS — B356 Tinea cruris: Secondary | ICD-10-CM | POA: Diagnosis not present

## 2018-05-04 DIAGNOSIS — E559 Vitamin D deficiency, unspecified: Secondary | ICD-10-CM | POA: Diagnosis not present

## 2018-05-04 DIAGNOSIS — I10 Essential (primary) hypertension: Secondary | ICD-10-CM | POA: Diagnosis not present

## 2018-05-04 DIAGNOSIS — E785 Hyperlipidemia, unspecified: Secondary | ICD-10-CM | POA: Diagnosis not present

## 2018-05-04 DIAGNOSIS — E039 Hypothyroidism, unspecified: Secondary | ICD-10-CM | POA: Diagnosis not present

## 2018-05-04 DIAGNOSIS — H9193 Unspecified hearing loss, bilateral: Secondary | ICD-10-CM | POA: Diagnosis not present

## 2018-05-04 DIAGNOSIS — Z0000 Encounter for general adult medical examination without abnormal findings: Secondary | ICD-10-CM | POA: Diagnosis not present

## 2018-05-04 DIAGNOSIS — R350 Frequency of micturition: Secondary | ICD-10-CM | POA: Diagnosis not present

## 2018-05-04 DIAGNOSIS — E119 Type 2 diabetes mellitus without complications: Secondary | ICD-10-CM | POA: Diagnosis not present

## 2018-06-06 DIAGNOSIS — R351 Nocturia: Secondary | ICD-10-CM | POA: Diagnosis not present

## 2018-06-06 DIAGNOSIS — R8279 Other abnormal findings on microbiological examination of urine: Secondary | ICD-10-CM | POA: Diagnosis not present

## 2018-06-08 DIAGNOSIS — E039 Hypothyroidism, unspecified: Secondary | ICD-10-CM | POA: Diagnosis not present

## 2018-06-08 DIAGNOSIS — R5383 Other fatigue: Secondary | ICD-10-CM | POA: Diagnosis not present

## 2018-06-08 DIAGNOSIS — M159 Polyosteoarthritis, unspecified: Secondary | ICD-10-CM | POA: Diagnosis not present

## 2018-06-08 DIAGNOSIS — L309 Dermatitis, unspecified: Secondary | ICD-10-CM | POA: Diagnosis not present

## 2018-06-08 DIAGNOSIS — I10 Essential (primary) hypertension: Secondary | ICD-10-CM | POA: Diagnosis not present

## 2018-06-08 DIAGNOSIS — E119 Type 2 diabetes mellitus without complications: Secondary | ICD-10-CM | POA: Diagnosis not present

## 2018-06-08 DIAGNOSIS — Z9109 Other allergy status, other than to drugs and biological substances: Secondary | ICD-10-CM | POA: Diagnosis not present

## 2018-08-25 DIAGNOSIS — E785 Hyperlipidemia, unspecified: Secondary | ICD-10-CM | POA: Diagnosis not present

## 2018-08-25 DIAGNOSIS — E039 Hypothyroidism, unspecified: Secondary | ICD-10-CM | POA: Diagnosis not present

## 2018-09-28 DIAGNOSIS — Z1231 Encounter for screening mammogram for malignant neoplasm of breast: Secondary | ICD-10-CM | POA: Diagnosis not present

## 2018-11-02 DIAGNOSIS — L255 Unspecified contact dermatitis due to plants, except food: Secondary | ICD-10-CM | POA: Diagnosis not present

## 2019-02-08 DIAGNOSIS — Z23 Encounter for immunization: Secondary | ICD-10-CM | POA: Diagnosis not present

## 2019-06-09 DIAGNOSIS — E119 Type 2 diabetes mellitus without complications: Secondary | ICD-10-CM | POA: Diagnosis not present

## 2019-06-09 DIAGNOSIS — E559 Vitamin D deficiency, unspecified: Secondary | ICD-10-CM | POA: Diagnosis not present

## 2019-06-09 DIAGNOSIS — E7849 Other hyperlipidemia: Secondary | ICD-10-CM | POA: Diagnosis not present

## 2019-06-09 DIAGNOSIS — N183 Chronic kidney disease, stage 3 unspecified: Secondary | ICD-10-CM | POA: Diagnosis not present

## 2019-06-09 DIAGNOSIS — E039 Hypothyroidism, unspecified: Secondary | ICD-10-CM | POA: Diagnosis not present

## 2019-06-09 DIAGNOSIS — Z0000 Encounter for general adult medical examination without abnormal findings: Secondary | ICD-10-CM | POA: Diagnosis not present

## 2019-08-29 DIAGNOSIS — R351 Nocturia: Secondary | ICD-10-CM | POA: Diagnosis not present

## 2019-08-29 DIAGNOSIS — R8271 Bacteriuria: Secondary | ICD-10-CM | POA: Diagnosis not present

## 2019-11-13 DIAGNOSIS — Z6829 Body mass index (BMI) 29.0-29.9, adult: Secondary | ICD-10-CM | POA: Diagnosis not present

## 2019-11-13 DIAGNOSIS — Z1231 Encounter for screening mammogram for malignant neoplasm of breast: Secondary | ICD-10-CM | POA: Diagnosis not present

## 2019-11-13 DIAGNOSIS — Z01419 Encounter for gynecological examination (general) (routine) without abnormal findings: Secondary | ICD-10-CM | POA: Diagnosis not present

## 2020-02-08 DIAGNOSIS — E119 Type 2 diabetes mellitus without complications: Secondary | ICD-10-CM | POA: Diagnosis not present

## 2020-02-15 DIAGNOSIS — E119 Type 2 diabetes mellitus without complications: Secondary | ICD-10-CM | POA: Diagnosis not present

## 2020-02-15 DIAGNOSIS — Z23 Encounter for immunization: Secondary | ICD-10-CM | POA: Diagnosis not present

## 2020-02-26 DIAGNOSIS — R8271 Bacteriuria: Secondary | ICD-10-CM | POA: Diagnosis not present

## 2020-02-26 DIAGNOSIS — R351 Nocturia: Secondary | ICD-10-CM | POA: Diagnosis not present

## 2020-07-09 DIAGNOSIS — M810 Age-related osteoporosis without current pathological fracture: Secondary | ICD-10-CM | POA: Diagnosis not present

## 2020-07-09 DIAGNOSIS — H9193 Unspecified hearing loss, bilateral: Secondary | ICD-10-CM | POA: Diagnosis not present

## 2020-07-09 DIAGNOSIS — E039 Hypothyroidism, unspecified: Secondary | ICD-10-CM | POA: Diagnosis not present

## 2020-07-09 DIAGNOSIS — F419 Anxiety disorder, unspecified: Secondary | ICD-10-CM | POA: Diagnosis not present

## 2020-07-09 DIAGNOSIS — Z7984 Long term (current) use of oral hypoglycemic drugs: Secondary | ICD-10-CM | POA: Diagnosis not present

## 2020-07-09 DIAGNOSIS — R0600 Dyspnea, unspecified: Secondary | ICD-10-CM | POA: Diagnosis not present

## 2020-07-09 DIAGNOSIS — E785 Hyperlipidemia, unspecified: Secondary | ICD-10-CM | POA: Diagnosis not present

## 2020-07-09 DIAGNOSIS — E119 Type 2 diabetes mellitus without complications: Secondary | ICD-10-CM | POA: Diagnosis not present

## 2020-07-09 DIAGNOSIS — E559 Vitamin D deficiency, unspecified: Secondary | ICD-10-CM | POA: Diagnosis not present

## 2020-09-15 DIAGNOSIS — M542 Cervicalgia: Secondary | ICD-10-CM | POA: Diagnosis not present

## 2020-09-15 DIAGNOSIS — H6981 Other specified disorders of Eustachian tube, right ear: Secondary | ICD-10-CM | POA: Diagnosis not present

## 2020-09-15 DIAGNOSIS — M26601 Right temporomandibular joint disorder, unspecified: Secondary | ICD-10-CM | POA: Diagnosis not present

## 2020-10-25 DIAGNOSIS — E119 Type 2 diabetes mellitus without complications: Secondary | ICD-10-CM | POA: Diagnosis not present

## 2020-10-25 DIAGNOSIS — Z7984 Long term (current) use of oral hypoglycemic drugs: Secondary | ICD-10-CM | POA: Diagnosis not present

## 2020-10-25 DIAGNOSIS — I10 Essential (primary) hypertension: Secondary | ICD-10-CM | POA: Diagnosis not present

## 2020-10-25 DIAGNOSIS — R0789 Other chest pain: Secondary | ICD-10-CM | POA: Diagnosis not present

## 2020-10-25 DIAGNOSIS — E039 Hypothyroidism, unspecified: Secondary | ICD-10-CM | POA: Diagnosis not present

## 2021-01-27 NOTE — Progress Notes (Deleted)
Aniceto Boss MD Reason for referral-chest pain  HPI: 80 year old female for evaluation of chest pain at request of Aura Dials, MD.  Patient seen previously but not since 2016.  Nuclear study October 2016 showed ejection fraction 82% and no ischemia.  Current Outpatient Medications  Medication Sig Dispense Refill   glipiZIDE (GLUCOTROL) 5 MG tablet Take 5 mg by mouth daily before breakfast.     hydrochlorothiazide (HYDRODIURIL) 25 MG tablet Take 25 mg by mouth daily.     ibuprofen (ADVIL,MOTRIN) 200 MG tablet Take 200 mg by mouth as needed for moderate pain. For pain     levothyroxine (SYNTHROID, LEVOTHROID) 88 MCG tablet Take 88 mcg by mouth daily.     Omega-3 Fatty Acids (OMEGA 3 PO) Take 1 capsule by mouth daily.     potassium chloride SA (K-DUR,KLOR-CON) 20 MEQ tablet Take 20 mEq by mouth daily.     simvastatin (ZOCOR) 20 MG tablet Take 20 mg by mouth every evening.     valsartan (DIOVAN) 160 MG tablet Take 160 mg by mouth daily.     No current facility-administered medications for this visit.    Allergies  Allergen Reactions   Iohexol Hives     Desc: HIVES W/ IV CONTRAST 20 YRS AGO/A.C.PRE-MED W/ PREDNISONE AND BENADRYAL OK-05/14/05   Code: HIVES, Desc: HIVES FROM IV DYE IN 1988    Latex Itching and Rash     Past Medical History:  Diagnosis Date   Diabetes mellitus    Hyperlipemia    Hypertension    Hypothyroidism    Lung nodule     Past Surgical History:  Procedure Laterality Date   ABDOMINAL HYSTERECTOMY  1988   CATARACT EXTRACTION Bilateral    CHOLECYSTECTOMY  1990   TUBAL LIGATION  1974    Social History   Socioeconomic History   Marital status: Married    Spouse name: Not on file   Number of children: 4   Years of education: Not on file   Highest education level: Not on file  Occupational History   Not on file  Tobacco Use   Smoking status: Former    Types: Cigarettes   Smokeless tobacco: Not on file  Substance and Sexual  Activity   Alcohol use: Yes    Alcohol/week: 0.0 standard drinks    Comment: Wine occasionally   Drug use: No   Sexual activity: Not on file  Other Topics Concern   Not on file  Social History Narrative   Not on file   Social Determinants of Health   Financial Resource Strain: Not on file  Food Insecurity: Not on file  Transportation Needs: Not on file  Physical Activity: Not on file  Stress: Not on file  Social Connections: Not on file  Intimate Partner Violence: Not on file    Family History  Problem Relation Age of Onset   Heart failure Mother    Heart failure Father    CAD Father        Died at age 43 of MI    ROS: no fevers or chills, productive cough, hemoptysis, dysphasia, odynophagia, melena, hematochezia, dysuria, hematuria, rash, seizure activity, orthopnea, PND, pedal edema, claudication. Remaining systems are negative.  Physical Exam:   There were no vitals taken for this visit.  General:  Well developed/well nourished in NAD Skin warm/dry Patient not depressed No peripheral clubbing Back-normal HEENT-normal/normal eyelids Neck supple/normal carotid upstroke bilaterally; no bruits; no JVD; no thyromegaly chest - CTA/ normal expansion CV -  RRR/normal S1 and S2; no murmurs, rubs or gallops;  PMI nondisplaced Abdomen -NT/ND, no HSM, no mass, + bowel sounds, no bruit 2+ femoral pulses, no bruits Ext-no edema, chords, 2+ DP Neuro-grossly nonfocal  ECG - personally reviewed  A/P  1 chest pain-  2 hypertension-patient's blood pressure is controlled.  Continue present medications and monitor.  3 hyperlipidemia-continue statin.  Kirk Ruths, MD

## 2021-02-05 ENCOUNTER — Ambulatory Visit: Payer: Medicare Other | Admitting: Cardiology

## 2021-03-10 NOTE — Progress Notes (Signed)
Aniceto Boss, MD Reason for referral-chest pain  HPI: 80 year old female for evaluation of chest pain at request of Aura Dials, MD.  Patient seen previously but not since 2016.  Nuclear study October 2016 showed fixed apical defect but no ischemia and ejection fraction 82%.  Patient states that for the past 6 months she has had increased dyspnea on exertion.  She denies orthopnea, PND, pedal edema or syncope.  She occasionally has chest discomfort in the substernal area after eating late at night.  No associated symptoms.  She does not have exertional chest pain.  She denies fevers, chills or productive cough.  No hemoptysis.  Cardiology now asked to evaluate.  Current Outpatient Medications  Medication Sig Dispense Refill   ergocalciferol (VITAMIN D2) 1.25 MG (50000 UT) capsule Take by mouth.     glipiZIDE (GLUCOTROL) 5 MG tablet Take 5 mg by mouth daily before breakfast.     hydrochlorothiazide (HYDRODIURIL) 25 MG tablet Take 25 mg by mouth daily.     ibuprofen (ADVIL,MOTRIN) 200 MG tablet Take 200 mg by mouth as needed for moderate pain. For pain     levothyroxine (SYNTHROID, LEVOTHROID) 88 MCG tablet Take 88 mcg by mouth daily.     losartan (COZAAR) 100 MG tablet Take 100 mg by mouth daily.     potassium chloride SA (K-DUR,KLOR-CON) 20 MEQ tablet Take 20 mEq by mouth daily.     rosuvastatin (CRESTOR) 20 MG tablet Take by mouth.     No current facility-administered medications for this visit.    Allergies  Allergen Reactions   Iohexol Hives     Desc: HIVES W/ IV CONTRAST 20 YRS AGO/A.C.PRE-MED W/ PREDNISONE AND BENADRYAL OK-05/14/05   Code: HIVES, Desc: HIVES FROM IV DYE IN 1988    Latex Itching and Rash     Past Medical History:  Diagnosis Date   Diabetes mellitus    Hyperlipemia    Hypertension    Hypothyroidism    Lung nodule     Past Surgical History:  Procedure Laterality Date   ABDOMINAL HYSTERECTOMY  1988   CATARACT EXTRACTION Bilateral     CHOLECYSTECTOMY  1990   TUBAL LIGATION  1974    Social History   Socioeconomic History   Marital status: Married    Spouse name: Not on file   Number of children: 4   Years of education: Not on file   Highest education level: Not on file  Occupational History   Not on file  Tobacco Use   Smoking status: Former    Types: Cigarettes   Smokeless tobacco: Not on file  Substance and Sexual Activity   Alcohol use: Never   Drug use: No   Sexual activity: Not on file  Other Topics Concern   Not on file  Social History Narrative   Not on file   Social Determinants of Health   Financial Resource Strain: Not on file  Food Insecurity: Not on file  Transportation Needs: Not on file  Physical Activity: Not on file  Stress: Not on file  Social Connections: Not on file  Intimate Partner Violence: Not on file    Family History  Problem Relation Age of Onset   Heart failure Mother    Heart failure Father    CAD Father        Died at age 67 of MI    ROS: Arthritis but no fevers or chills, productive cough, hemoptysis, dysphasia, odynophagia, melena, hematochezia, dysuria, hematuria, rash, seizure activity, orthopnea,  PND, pedal edema, claudication. Remaining systems are negative.  Physical Exam:   Blood pressure 124/60, pulse 93, height 5' (1.524 m), weight 159 lb 3.2 oz (72.2 kg).  General:  Well developed/well nourished in NAD Skin warm/dry Patient not depressed No peripheral clubbing Back-normal HEENT-normal/normal eyelids Neck supple/normal carotid upstroke bilaterally; no bruits; no JVD; no thyromegaly chest - CTA/ normal expansion CV - RRR/normal S1 and S2; no murmurs, rubs or gallops;  PMI nondisplaced Abdomen -NT/ND, no HSM, no mass, + bowel sounds, no bruit 2+ femoral pulses, no bruits Ext-no edema, chords, 2+ DP Neuro-grossly nonfocal  ECG -normal sinus rhythm at a rate of 93, left anterior fascicular block, incomplete right bundle branch block, no ST changes.   Personally reviewed  A/P  1 chest pain/dyspnea--patient complains of predominantly dyspnea on exertion.  She has occasional atypical chest pain at night.  Electrocardiogram shows no diagnostic ST changes but multiple risk factors.  I will arrange a cardiac CTA to rule out obstructive coronary disease.  Note she has a dye allergy and will premedicate with steroids and Benadryl.  I will also arrange an echocardiogram to assess LV function.  2 hypertension-blood pressure controlled.  Continue present medications.  3 hyperlipidemia-continue statin.  Kirk Ruths, MD

## 2021-03-13 ENCOUNTER — Other Ambulatory Visit: Payer: Self-pay

## 2021-03-13 ENCOUNTER — Ambulatory Visit (INDEPENDENT_AMBULATORY_CARE_PROVIDER_SITE_OTHER): Payer: Medicare Other | Admitting: Cardiology

## 2021-03-13 ENCOUNTER — Encounter: Payer: Self-pay | Admitting: Cardiology

## 2021-03-13 VITALS — BP 124/60 | HR 93 | Ht 60.0 in | Wt 159.2 lb

## 2021-03-13 DIAGNOSIS — R072 Precordial pain: Secondary | ICD-10-CM | POA: Diagnosis not present

## 2021-03-13 DIAGNOSIS — I10 Essential (primary) hypertension: Secondary | ICD-10-CM | POA: Diagnosis not present

## 2021-03-13 DIAGNOSIS — E7800 Pure hypercholesterolemia, unspecified: Secondary | ICD-10-CM

## 2021-03-13 DIAGNOSIS — R0602 Shortness of breath: Secondary | ICD-10-CM | POA: Diagnosis not present

## 2021-03-13 MED ORDER — METOPROLOL TARTRATE 100 MG PO TABS: ORAL_TABLET | ORAL | 0 refills | Status: DC

## 2021-03-13 MED ORDER — PREDNISONE 50 MG PO TABS: ORAL_TABLET | ORAL | 0 refills | Status: DC

## 2021-03-13 NOTE — Patient Instructions (Signed)
Testing/Procedures:  Your cardiac CT will be scheduled at   [facility] [place],  [address] [phone]  If scheduled at [facility], please arrive at the [facility] main entrance (entrance A) of [facility] 30 minutes prior to test start time. You can use the FREE valet parking offered at the main entrance (encouraged to control the heart rate for the test) Proceed to the [facility] Radiology Department (first floor) to check-in and test prep.   Please follow these instructions carefully (unless otherwise directed):   On the Night Before the Test: Be sure to Drink plenty of water. Do not consume any caffeinated/decaffeinated beverages or chocolate 12 hours prior to your test. Do not take any antihistamines 12 hours prior to your test. If the patient has contrast allergy: Patient will need a prescription for Prednisone and very clear instructions (as follows): Prednisone 50 mg - take 13 hours prior to test Take another Prednisone 50 mg 7 hours prior to test Take another Prednisone 50 mg 1 hour prior to test Take Benadryl 50 mg 1 hour prior to test Patient must complete all four doses of above prophylactic medications. Patient will need a ride after test due to Benadryl.  On the Day of the Test: Drink plenty of water until 1 hour prior to the test. Do not eat any food 4 hours prior to the test. You may take your regular medications prior to the test.  Take metoprolol (Lopressor) 100 mg two hours prior to test. HOLD Furosemide/Hydrochlorothiazide morning of the test. FEMALES- please wear underwire-free bra if available, avoid dresses & tight clothing      After the Test: Drink plenty of water. After receiving IV contrast, you may experience a mild flushed feeling. This is normal. On occasion, you may experience a mild rash up to 24 hours after the test. This is not dangerous. If this occurs, you can take Benadryl 25 mg and  increase your fluid intake. If you experience trouble breathing, this can be serious. If it is severe call 911 IMMEDIATELY. If it is mild, please call our office. If you take any of these medications: Glipizide/Metformin, Avandament, Glucavance, please do not take 48 hours after completing test unless otherwise instructed.  Please allow 2-4 weeks for scheduling of routine cardiac CTs. Some insurance companies require a pre-authorization which may delay scheduling of this test.   For non-scheduling related questions, please contact the cardiac imaging nurse navigator should you have any questions/concerns: Marchia Bond, Cardiac Imaging Nurse Navigator Gordy Clement, Cardiac Imaging Nurse Navigator [facility] Direct Office Dial: [phone]   For scheduling needs, including cancellations and rescheduling, please call [place], [phone].    Your physician has requested that you have an echocardiogram. Echocardiography is a painless test that uses sound waves to create images of your heart. It provides your doctor with information about the size and shape of your heart and how well your heart's chambers and valves are working. This procedure takes approximately one hour. There are no restrictions for this procedure. [place]   Follow-Up: At [facility], you and your health needs are our priority.  As part of our continuing mission to provide you with exceptional heart care, we have created designated Provider Care Teams.  These Care Teams include your primary Cardiologist (physician) and Advanced Practice Providers (APPs -  Physician Assistants and Nurse Practitioners) who all work together to provide you with the care you  need, when you need it.  We recommend signing up for the patient portal called "MyChart".  Sign up information is provided on this After Visit Summary.  MyChart is used to connect with patients for Virtual Visits (Telemedicine).   Patients are able to view lab/test results, encounter notes, upcoming appointments, etc.  Non-urgent messages can be sent to your provider as well.   To learn more about what you can do with MyChart, go to [email or web address].    Your next appointment:   4 month(s)  The format for your next appointment:   In Person  Provider:   Kirk Ruths MD

## 2021-03-25 ENCOUNTER — Telehealth (HOSPITAL_COMMUNITY): Payer: Self-pay | Admitting: *Deleted

## 2021-03-25 DIAGNOSIS — R072 Precordial pain: Secondary | ICD-10-CM | POA: Diagnosis not present

## 2021-03-25 NOTE — Telephone Encounter (Signed)
Patient returning call regarding upcoming cardiac imaging study; pt verbalizes understanding of appt date/time, parking situation and where to check in, pre-test NPO status and medications ordered, and verified current allergies; name and call back number provided for further questions should they arise  Gordy Clement RN Navigator Cardiac [facility] [phone] office [phone] cell  Patient verbalized understanding of how to take 13 hour prep in addition to take 100mg  metoprolol tartrate two hours prior to cardiac CT. She is aware to obtain blood work prior to test and to arrive at 2pm for her 2:30pm scan.

## 2021-03-25 NOTE — Telephone Encounter (Signed)
Attempted to call patient regarding upcoming cardiac CT appointment and to remind patient to get blood work. Left message on voicemail with name and callback number  Gordy Clement RN Navigator Cardiac [facility] [phone] Office [phone] Cell

## 2021-03-26 LAB — BASIC METABOLIC PANEL
BUN/Creatinine Ratio: 18 (ref 12–28)
BUN: 19 mg/dL (ref 8–27)
CO2: 25 mmol/L (ref 20–29)
Calcium: 9.4 mg/dL (ref 8.7–10.3)
Chloride: 102 mmol/L (ref 96–106)
Creatinine, Ser: 1.05 mg/dL — ABNORMAL HIGH (ref 0.57–1.00)
Glucose: 93 mg/dL (ref 70–99)
Potassium: 3.8 mmol/L (ref 3.5–5.2)
Sodium: 141 mmol/L (ref 134–144)
eGFR: 54 mL/min/{1.73_m2} — ABNORMAL LOW (ref >59)

## 2021-03-27 ENCOUNTER — Other Ambulatory Visit: Payer: Self-pay | Admitting: Cardiovascular Disease

## 2021-03-27 ENCOUNTER — Ambulatory Visit (HOSPITAL_COMMUNITY)
Admission: RE | Admit: 2021-03-27 | Discharge: 2021-03-27 | Disposition: A | Discharge status: Home / Self Care | Payer: Medicare Other | Source: Ambulatory Visit | Attending: Cardiovascular Disease | Admitting: Cardiovascular Disease

## 2021-03-27 ENCOUNTER — Ambulatory Visit (HOSPITAL_COMMUNITY): Payer: TRICARE For Life (TFL)

## 2021-03-27 ENCOUNTER — Other Ambulatory Visit: Payer: Self-pay

## 2021-03-27 ENCOUNTER — Ambulatory Visit (HOSPITAL_COMMUNITY)
Admission: RE | Admit: 2021-03-27 | Discharge: 2021-03-27 | Disposition: A | Discharge status: Home / Self Care | Payer: Medicare Other | Source: Ambulatory Visit | Attending: Cardiology | Admitting: Cardiology

## 2021-03-27 DIAGNOSIS — R931 Abnormal findings on diagnostic imaging of heart and coronary circulation: Secondary | ICD-10-CM

## 2021-03-27 DIAGNOSIS — R072 Precordial pain: Secondary | ICD-10-CM | POA: Diagnosis not present

## 2021-03-27 DIAGNOSIS — I2510 Atherosclerotic heart disease of native coronary artery without angina pectoris: Secondary | ICD-10-CM | POA: Diagnosis not present

## 2021-03-27 MED ORDER — METOPROLOL TARTRATE 5 MG/5ML IV SOLN
INTRAVENOUS | Status: AC
  Filled 2021-03-27: qty 10

## 2021-03-27 MED ORDER — NITROGLYCERIN 0.4 MG SL SUBL
SUBLINGUAL_TABLET | SUBLINGUAL | Status: AC
  Filled 2021-03-27: qty 2

## 2021-03-27 MED ORDER — IOHEXOL 350 MG/ML SOLN
95.0000 mL | Freq: Once | INTRAVENOUS | Status: AC | PRN
  Administered 2021-03-27: 95 mL via INTRAVENOUS

## 2021-03-27 MED ORDER — DILTIAZEM HCL 25 MG/5ML IV SOLN
INTRAVENOUS | Status: AC
  Administered 2021-03-27: 10 mg via INTRAVENOUS
  Filled 2021-03-27: qty 5

## 2021-03-27 MED ORDER — NITROGLYCERIN 0.4 MG SL SUBL
0.8000 mg | SUBLINGUAL_TABLET | Freq: Once | SUBLINGUAL | Status: AC
  Administered 2021-03-27: 0.8 mg via SUBLINGUAL

## 2021-03-27 MED ORDER — METOPROLOL TARTRATE 5 MG/5ML IV SOLN
5.0000 mg | INTRAVENOUS | Status: DC | PRN
  Administered 2021-03-27: 10 mg via INTRAVENOUS

## 2021-03-27 MED ORDER — METOPROLOL TARTRATE 5 MG/5ML IV SOLN
INTRAVENOUS | Status: AC
  Administered 2021-03-27: 10 mg via INTRAVENOUS
  Filled 2021-03-27: qty 10

## 2021-03-27 MED ORDER — DILTIAZEM HCL 25 MG/5ML IV SOLN
5.0000 mg | INTRAVENOUS | Status: DC | PRN
Start: 2021-03-27 — End: 2021-03-28
  Administered 2021-03-27: 10 mg via INTRAVENOUS

## 2021-03-28 DIAGNOSIS — I2510 Atherosclerotic heart disease of native coronary artery without angina pectoris: Secondary | ICD-10-CM | POA: Diagnosis not present

## 2021-04-07 ENCOUNTER — Other Ambulatory Visit: Payer: Self-pay

## 2021-04-07 ENCOUNTER — Ambulatory Visit (HOSPITAL_COMMUNITY): Payer: Medicare Other | Attending: Cardiology

## 2021-04-07 DIAGNOSIS — R0602 Shortness of breath: Secondary | ICD-10-CM | POA: Diagnosis not present

## 2021-04-07 LAB — ECHOCARDIOGRAM COMPLETE
AR max vel: 1.61 cm2
AV Area VTI: 1.74 cm2
AV Area mean vel: 1.53 cm2
AV Mean grad: 7 mmHg
AV Peak grad: 11.7 mmHg
Ao pk vel: 1.71 m/s
Area-P 1/2: 4.1 cm2
S' Lateral: 1.7 cm

## 2021-04-09 DIAGNOSIS — Z23 Encounter for immunization: Secondary | ICD-10-CM | POA: Diagnosis not present

## 2021-04-30 NOTE — Progress Notes (Signed)
HPI: Follow-up coronary artery disease.  Patient seen recently with chest pain.  Echocardiogram December 2022 showed normal LV function, moderate basal septal hypertrophy, grade 1 diastolic dysfunction.  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  FFR modeled as total occlusion but no other obstructive disease noted.  Since last seen she has some dyspnea on exertion.  No orthopnea, PND, chest pain or syncope.  Current Outpatient Medications  Medication Sig Dispense Refill   ergocalciferol (VITAMIN D2) 1.25 MG (50000 UT) capsule Take by mouth.     FREESTYLE LITE test strip      glipiZIDE (GLUCOTROL) 5 MG tablet Take 5 mg by mouth daily before breakfast.     hydrochlorothiazide (HYDRODIURIL) 25 MG tablet Take 25 mg by mouth daily.     ibuprofen (ADVIL,MOTRIN) 200 MG tablet Take 200 mg by mouth as needed for moderate pain. For pain     levothyroxine (SYNTHROID) 75 MCG tablet Take 75 mcg by mouth daily.     losartan (COZAAR) 100 MG tablet Take 100 mg by mouth daily.     potassium chloride (KLOR-CON M) 10 MEQ tablet Take 10 mEq by mouth daily.     rosuvastatin (CRESTOR) 20 MG tablet Take by mouth.     levothyroxine (SYNTHROID, LEVOTHROID) 88 MCG tablet Take 88 mcg by mouth daily. (Patient not taking: Reported on 05/13/2021)     metoprolol tartrate (LOPRESSOR) 100 MG tablet Take 2 hours prior to ct scan (Patient not taking: Reported on 05/13/2021) 1 tablet 0   predniSONE (DELTASONE) 50 MG tablet Take one tablet 13 hours prior to scan, 1 tablet 7 hours prior to scan and 1 tablet 1 hour prior to scan (Patient not taking: Reported on 05/13/2021) 3 tablet 0   No current facility-administered medications for this visit.     Past Medical History:  Diagnosis Date   Diabetes mellitus    Hyperlipemia     Hypertension    Hypothyroidism    Lung nodule     Past Surgical History:  Procedure Laterality Date   ABDOMINAL HYSTERECTOMY  1988   CATARACT EXTRACTION Bilateral    CHOLECYSTECTOMY  1990   TUBAL LIGATION  1974    Social History   Socioeconomic History   Marital status: Married    Spouse name: Not on file   Number of children: 4   Years of education: Not on file   Highest education level: Not on file  Occupational History   Not on file  Tobacco Use   Smoking status: Former    Types: Cigarettes   Smokeless tobacco: Not on file  Substance and Sexual Activity   Alcohol use: Never   Drug use: No   Sexual activity: Not on file  Other Topics Concern   Not on file  Social History Narrative   Not on file   Social Determinants of Health   Financial Resource Strain: Not on file  Food Insecurity: Not on file  Transportation Needs: Not on file  Physical Activity: Not on file  Stress: Not on file  Social Connections: Not on file  Intimate Partner Violence: Not on file    Family History  Problem Relation Age of Onset   Heart failure Mother    Heart failure Father    CAD Father  Died at age 59 of MI    ROS: no fevers or chills, productive cough, hemoptysis, dysphasia, odynophagia, melena, hematochezia, dysuria, hematuria, rash, seizure activity, orthopnea, PND, pedal edema, claudication. Remaining systems are negative.  Physical Exam: Well-developed well-nourished in no acute distress.  Skin is warm and dry.  HEENT is normal.  Neck is supple.  Chest is clear to auscultation with normal expansion.  Cardiovascular exam is regular rate and rhythm.  Abdominal exam nontender or distended. No masses palpated. Extremities show no edema. neuro grossly intact  ECG-normal sinus rhythm at a rate of 85, cannot rule out inferior infarct.  Personally reviewed  A/P  1 coronary artery disease-cardiac CTA as outlined above.  She continues to have dyspnea on exertion.   Question if this may be an anginal equivalent.  I would like for her to undergo cardiac catheterization to see if she would benefit from PCI or coronary artery bypass graft.  However she states that her platelet count has dropped to 50,000 recently.  This would be problematic if she required PCI (dual antiplatelet therapy) or coronary artery bypass graft.  We will therefore ask her to see hematology first.  Once this issue is resolved we can consider proceeding with cardiac catheterization to follow (her dyspnea on exertion has been present for 1 year).  I will not add aspirin at this point given thrombocytopenia.  We will have labs forwarded to [place] from Dr. March Rummage office.  I will increase Crestor from 20 to 40 mg daily.  2 hypertension-patient's blood pressure is controlled.  Continue present medical regimen.  3 hyperlipidemia-given documented coronary disease will increase Crestor from 20 to 40 mg daily.  Check lipids and liver in 8 weeks.  4 thrombocytopenia-we will arrange hematology evaluation.  Kirk Ruths, MD

## 2021-05-02 DIAGNOSIS — H9193 Unspecified hearing loss, bilateral: Secondary | ICD-10-CM | POA: Diagnosis not present

## 2021-05-02 DIAGNOSIS — M810 Age-related osteoporosis without current pathological fracture: Secondary | ICD-10-CM | POA: Diagnosis not present

## 2021-05-02 DIAGNOSIS — G63 Polyneuropathy in diseases classified elsewhere: Secondary | ICD-10-CM | POA: Diagnosis not present

## 2021-05-02 DIAGNOSIS — E119 Type 2 diabetes mellitus without complications: Secondary | ICD-10-CM | POA: Diagnosis not present

## 2021-05-02 DIAGNOSIS — E785 Hyperlipidemia, unspecified: Secondary | ICD-10-CM | POA: Diagnosis not present

## 2021-05-02 DIAGNOSIS — E559 Vitamin D deficiency, unspecified: Secondary | ICD-10-CM | POA: Diagnosis not present

## 2021-05-02 DIAGNOSIS — N390 Urinary tract infection, site not specified: Secondary | ICD-10-CM | POA: Diagnosis not present

## 2021-05-02 DIAGNOSIS — N183 Chronic kidney disease, stage 3 unspecified: Secondary | ICD-10-CM | POA: Diagnosis not present

## 2021-05-02 DIAGNOSIS — Z7984 Long term (current) use of oral hypoglycemic drugs: Secondary | ICD-10-CM | POA: Diagnosis not present

## 2021-05-02 DIAGNOSIS — I10 Essential (primary) hypertension: Secondary | ICD-10-CM | POA: Diagnosis not present

## 2021-05-13 ENCOUNTER — Encounter: Payer: Self-pay | Admitting: Cardiology

## 2021-05-13 ENCOUNTER — Other Ambulatory Visit: Payer: Self-pay

## 2021-05-13 ENCOUNTER — Ambulatory Visit (INDEPENDENT_AMBULATORY_CARE_PROVIDER_SITE_OTHER): Payer: Medicare Other | Admitting: Cardiology

## 2021-05-13 VITALS — BP 128/72 | HR 85 | Ht 60.0 in | Wt 158.8 lb

## 2021-05-13 DIAGNOSIS — R931 Abnormal findings on diagnostic imaging of heart and coronary circulation: Secondary | ICD-10-CM

## 2021-05-13 DIAGNOSIS — I2510 Atherosclerotic heart disease of native coronary artery without angina pectoris: Secondary | ICD-10-CM

## 2021-05-13 DIAGNOSIS — R0602 Shortness of breath: Secondary | ICD-10-CM

## 2021-05-13 DIAGNOSIS — E7800 Pure hypercholesterolemia, unspecified: Secondary | ICD-10-CM | POA: Diagnosis not present

## 2021-05-13 DIAGNOSIS — I10 Essential (primary) hypertension: Secondary | ICD-10-CM | POA: Diagnosis not present

## 2021-05-13 MED ORDER — ROSUVASTATIN CALCIUM 40 MG PO TABS: 40.0000 mg | ORAL_TABLET | Freq: Every day | ORAL | 3 refills | Status: DC

## 2021-05-13 NOTE — Patient Instructions (Signed)
Medication Instructions:   INCREASE ROSUVASTATIN TO 40 MG ONCE DAILY= 2 OF THE 20 MG TABLETS ONCE DAILY  *If you need a refill on your cardiac medications before your next appointment, please call your pharmacy*   Lab Work:  Your physician recommends that you return for lab work in: [place]  If you have labs (blood work) drawn today and your tests are completely normal, you will receive your results only by: [place] (if you have MyChart) OR A paper copy in the mail If you have any lab test that is abnormal or we need to change your treatment, we will call you to review the results.   Follow-Up: At [facility], you and your health needs are our priority.  As part of our continuing mission to provide you with exceptional heart care, we have created designated Provider Care Teams.  These Care Teams include your primary Cardiologist (physician) and Advanced Practice Providers (APPs -  Physician Assistants and Nurse Practitioners) who all work together to provide you with the care you need, when you need it.  We recommend signing up for the patient portal called "MyChart".  Sign up information is provided on this After Visit Summary.  MyChart is used to connect with patients for Virtual Visits (Telemedicine).  Patients are able to view lab/test results, encounter notes, upcoming appointments, etc.  Non-urgent messages can be sent to your provider as well.   To learn more about what you can do with MyChart, go to [email or web address].    Your next appointment:   8 week(s)  The format for your next appointment:   In Person  Provider:   Kirk Ruths MD

## 2021-05-19 ENCOUNTER — Other Ambulatory Visit: Payer: Self-pay | Admitting: Family

## 2021-05-19 DIAGNOSIS — D696 Thrombocytopenia, unspecified: Secondary | ICD-10-CM

## 2021-05-20 ENCOUNTER — Inpatient Hospital Stay (HOSPITAL_BASED_OUTPATIENT_CLINIC_OR_DEPARTMENT_OTHER): Payer: Medicare Other | Admitting: Family

## 2021-05-20 ENCOUNTER — Other Ambulatory Visit: Payer: Self-pay

## 2021-05-20 ENCOUNTER — Inpatient Hospital Stay: Payer: Medicare Other | Attending: Family

## 2021-05-20 VITALS — BP 115/62 | HR 88 | Temp 97.8°F | Resp 19 | Ht 60.0 in | Wt 154.0 lb

## 2021-05-20 DIAGNOSIS — D696 Thrombocytopenia, unspecified: Secondary | ICD-10-CM

## 2021-05-20 DIAGNOSIS — M25562 Pain in left knee: Secondary | ICD-10-CM | POA: Diagnosis not present

## 2021-05-20 DIAGNOSIS — Z888 Allergy status to other drugs, medicaments and biological substances status: Secondary | ICD-10-CM | POA: Diagnosis not present

## 2021-05-20 DIAGNOSIS — E119 Type 2 diabetes mellitus without complications: Secondary | ICD-10-CM | POA: Insufficient documentation

## 2021-05-20 DIAGNOSIS — G629 Polyneuropathy, unspecified: Secondary | ICD-10-CM

## 2021-05-20 DIAGNOSIS — E039 Hypothyroidism, unspecified: Secondary | ICD-10-CM | POA: Insufficient documentation

## 2021-05-20 DIAGNOSIS — R0602 Shortness of breath: Secondary | ICD-10-CM | POA: Diagnosis not present

## 2021-05-20 DIAGNOSIS — Z90710 Acquired absence of both cervix and uterus: Secondary | ICD-10-CM | POA: Diagnosis not present

## 2021-05-20 DIAGNOSIS — Z7984 Long term (current) use of oral hypoglycemic drugs: Secondary | ICD-10-CM | POA: Insufficient documentation

## 2021-05-20 DIAGNOSIS — D473 Essential (hemorrhagic) thrombocythemia: Secondary | ICD-10-CM

## 2021-05-20 DIAGNOSIS — M25561 Pain in right knee: Secondary | ICD-10-CM

## 2021-05-20 DIAGNOSIS — Z87891 Personal history of nicotine dependence: Secondary | ICD-10-CM | POA: Insufficient documentation

## 2021-05-20 DIAGNOSIS — Z8249 Family history of ischemic heart disease and other diseases of the circulatory system: Secondary | ICD-10-CM | POA: Insufficient documentation

## 2021-05-20 DIAGNOSIS — Z887 Allergy status to serum and vaccine status: Secondary | ICD-10-CM | POA: Insufficient documentation

## 2021-05-20 DIAGNOSIS — D75839 Thrombocytosis, unspecified: Secondary | ICD-10-CM

## 2021-05-20 DIAGNOSIS — Z79899 Other long term (current) drug therapy: Secondary | ICD-10-CM

## 2021-05-20 DIAGNOSIS — R079 Chest pain, unspecified: Secondary | ICD-10-CM

## 2021-05-20 DIAGNOSIS — Z9049 Acquired absence of other specified parts of digestive tract: Secondary | ICD-10-CM | POA: Diagnosis not present

## 2021-05-20 DIAGNOSIS — Z79890 Hormone replacement therapy (postmenopausal): Secondary | ICD-10-CM | POA: Diagnosis not present

## 2021-05-20 DIAGNOSIS — E785 Hyperlipidemia, unspecified: Secondary | ICD-10-CM | POA: Diagnosis not present

## 2021-05-20 DIAGNOSIS — Z87440 Personal history of urinary (tract) infections: Secondary | ICD-10-CM

## 2021-05-20 LAB — CBC WITH DIFFERENTIAL (CANCER CENTER ONLY)
Abs Immature Granulocytes: 0 10*3/uL (ref 0.00–0.07)
Basophils Absolute: 0 10*3/uL (ref 0.0–0.1)
Basophils Relative: 0 %
Eosinophils Absolute: 0.1 10*3/uL (ref 0.0–0.5)
Eosinophils Relative: 1 %
HCT: 37.6 % (ref 36.0–46.0)
Hemoglobin: 12.4 g/dL (ref 12.0–15.0)
Immature Granulocytes: 0 %
Lymphocytes Relative: 25 %
Lymphs Abs: 1.4 10*3/uL (ref 0.7–4.0)
MCH: 29 pg (ref 26.0–34.0)
MCHC: 33 g/dL (ref 30.0–36.0)
MCV: 87.9 fL (ref 80.0–100.0)
Monocytes Absolute: 0.6 10*3/uL (ref 0.1–1.0)
Monocytes Relative: 10 %
Neutro Abs: 3.6 10*3/uL (ref 1.7–7.7)
Neutrophils Relative %: 64 %
Platelet Count: 104 10*3/uL — ABNORMAL LOW (ref 150–400)
RBC: 4.28 MIL/uL (ref 3.87–5.11)
RDW: 14.8 % (ref 11.5–15.5)
WBC Count: 5.7 10*3/uL (ref 4.0–10.5)
nRBC: 0 % (ref 0.0–0.2)

## 2021-05-20 LAB — CMP (CANCER CENTER ONLY)
ALT: 15 U/L (ref 0–44)
AST: 31 U/L (ref 15–41)
Albumin: 4.1 g/dL (ref 3.5–5.0)
Alkaline Phosphatase: 62 U/L (ref 38–126)
Anion gap: 9 (ref 5–15)
BUN: 26 mg/dL — ABNORMAL HIGH (ref 8–23)
CO2: 28 mmol/L (ref 22–32)
Calcium: 10.5 mg/dL — ABNORMAL HIGH (ref 8.9–10.3)
Chloride: 101 mmol/L (ref 98–111)
Creatinine: 1.16 mg/dL — ABNORMAL HIGH (ref 0.44–1.00)
GFR, Estimated: 48 mL/min — ABNORMAL LOW (ref >60)
Glucose, Bld: 90 mg/dL (ref 70–99)
Potassium: 3.6 mmol/L (ref 3.5–5.1)
Sodium: 138 mmol/L (ref 135–145)
Total Bilirubin: 1 mg/dL (ref 0.3–1.2)
Total Protein: 7.2 g/dL (ref 6.5–8.1)

## 2021-05-20 LAB — SAVE SMEAR(SSMR), FOR PROVIDER SLIDE REVIEW

## 2021-05-20 LAB — LACTATE DEHYDROGENASE: LDH: 199 U/L — ABNORMAL HIGH (ref 98–192)

## 2021-05-20 NOTE — Progress Notes (Signed)
Hematology/Oncology Consultation   Name: Victoria Ferguson      MRN: [id number]    Location: Room/bed info not found  Date: 05/20/2021 Time:3:23 PM   REFERRING PHYSICIAN:  Aura Dials, MD  REASON FOR CONSULT: Essential thrombocythemia    DIAGNOSIS: Thrombocytopenia   HISTORY OF PRESENT ILLNESS: Victoria Ferguson is a very pleasant 81 yo caucasian female with recent history of mild thrombocytopenia.  She has remained asymptomatic. Platelet count today is stable at 104.  No blood loss noted. No abnormal bruising, no petechiae.  She states that she is cold natured, occasionally has SOB and chest pain with over exertion which she attributes to a heart blockage. She states that Dr. Stanford Breed wanted her to see [place] before scheduling her heart cath.  Spleen is still in place.  No known liver disease.   She completed Cipro for a UTI on 05/09/2021. She states that she has had "Jock itch" that waxes and wanes for many years. She notes that it is worse when she eats sugar.  No personal or known familial history of cancer.  She has had multiple surgeries in the last including hysterectomy and cholecystectomy without any complications.  She is diabetic and takes Glipizide.  She is on synthroid for hypothyroidism.  She has had no issue with frequent infections. No fever, n/v, cough, dizziness, chest pain, palpitations, abdominal pain or changes in bowel or bladder habits.  No swelling in her extremities.  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  Neuropathy in her feet is unchanged from baseline.  No falls or syncope to report.  She has maintained a good appetite and is staying well hydrated throughout the day. Her weight is stable at 154 lbs.  She quit smoking 15 years ago. No ETOH or recreational drug use.  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 She is now retired and takes care of her husband who has prostate cancer.   ROS: All other 10 point review of  systems is negative.   PAST MEDICAL HISTORY:   Past Medical History:  Diagnosis Date   Diabetes mellitus    Hyperlipemia    Hypertension    Hypothyroidism    Lung nodule     ALLERGIES: Allergies  Allergen Reactions   Iohexol Hives     Desc: HIVES W/ IV CONTRAST 20 YRS AGO/A.C.PRE-MED W/ PREDNISONE AND BENADRYAL OK-05/14/05   Code: HIVES, Desc: HIVES FROM IV DYE IN 1988    Tetanus-Diphth-Acell Pertussis     Other reaction(s): redness/swelling at inj. site   Latex Itching and Rash   Tape Rash      MEDICATIONS:  Current Outpatient Medications on File Prior to Visit  Medication Sig Dispense Refill   ergocalciferol (VITAMIN D2) 1.25 MG (50000 UT) capsule Take by mouth.     FREESTYLE LITE test strip      glipiZIDE (GLUCOTROL) 5 MG tablet Take 5 mg by mouth daily before breakfast.     hydrochlorothiazide (HYDRODIURIL) 25 MG tablet Take 25 mg by mouth daily.     ibuprofen (ADVIL,MOTRIN) 200 MG tablet Take 200 mg by mouth as needed for moderate pain. For pain     levothyroxine (SYNTHROID) 75 MCG tablet Take 75 mcg by mouth daily.     levothyroxine (SYNTHROID, LEVOTHROID) 88 MCG tablet Take 88 mcg by mouth daily. (Patient not taking: Reported on 05/13/2021)     losartan (COZAAR) 100 MG tablet Take 100 mg by mouth daily.  metoprolol tartrate (LOPRESSOR) 100 MG tablet Take 2 hours prior to ct scan (Patient not taking: Reported on 05/13/2021) 1 tablet 0   potassium chloride (KLOR-CON M) 10 MEQ tablet Take 10 mEq by mouth daily.     predniSONE (DELTASONE) 50 MG tablet Take one tablet 13 hours prior to scan, 1 tablet 7 hours prior to scan and 1 tablet 1 hour prior to scan (Patient not taking: Reported on 05/13/2021) 3 tablet 0   rosuvastatin (CRESTOR) 40 MG tablet Take 1 tablet (40 mg total) by mouth daily. 90 tablet 3   No current facility-administered medications on file prior to visit.     PAST SURGICAL HISTORY Past Surgical History:  Procedure Laterality Date   ABDOMINAL  HYSTERECTOMY  1988   CATARACT EXTRACTION Bilateral    CHOLECYSTECTOMY  1990   TUBAL LIGATION  1974    FAMILY HISTORY: Family History  Problem Relation Age of Onset   Heart failure Mother    Heart failure Father    CAD Father        Died at age 30 of MI    SOCIAL HISTORY:  reports that she has quit smoking. Her smoking use included cigarettes. She does not have any smokeless tobacco history on file. She reports that she does not drink alcohol and does not use drugs.  PERFORMANCE STATUS: The patient's performance status is 1 - Symptomatic but completely ambulatory  PHYSICAL EXAM: Most Recent Vital Signs: There were no vitals taken for this visit. There were no vitals taken for this visit.  General Appearance:    Alert, cooperative, no distress, appears stated age  Head:    Normocephalic, without obvious abnormality, atraumatic  Eyes:    PERRL, conjunctiva/corneas clear, EOM's intact, fundi    benign, both eyes        Throat:   Lips, mucosa, and tongue normal; teeth and gums normal  Neck:   Supple, symmetrical, trachea midline, no adenopathy;    thyroid:  no enlargement/tenderness/nodules; no carotid   bruit or JVD  Back:     Symmetric, no curvature, ROM normal, no CVA tenderness  Lungs:     Clear to auscultation bilaterally, respirations unlabored  Chest Wall:    No tenderness or deformity   Heart:    Regular rate and rhythm, S1 and S2 normal, no murmur, rub   or gallop     Abdomen:     Soft, non-tender, bowel sounds active all four quadrants,    no masses, no organomegaly        Extremities:   Extremities normal, atraumatic, no cyanosis or edema  Pulses:   2+ and symmetric all extremities  Skin:   Skin color, texture, turgor normal, no rashes or lesions  Lymph nodes:   Cervical, supraclavicular, and axillary nodes normal  Neurologic:   CNII-XII intact, normal strength, sensation and reflexes    throughout    LABORATORY DATA:  Results for orders placed or performed  in visit on 05/20/21 (from the past 48 hour(s))  CBC with Differential (Cancer Center Only)     Status: Abnormal   Collection Time: 05/20/21  2:52 PM  Result Value Ref Range   WBC Count 5.7 4.0 - 10.5 K/uL   RBC 4.28 3.87 - 5.11 MIL/uL   Hemoglobin 12.4 12.0 - 15.0 g/dL   HCT 37.6 36.0 - 46.0 %   MCV 87.9 80.0 - 100.0 fL   MCH 29.0 26.0 - 34.0 pg   MCHC 33.0 30.0 - 36.0 g/dL  RDW 14.8 11.5 - 15.5 %   Platelet Count 104 (L) 150 - 400 K/uL   nRBC 0.0 0.0 - 0.2 %   Neutrophils Relative % 64 %   Neutro Abs 3.6 1.7 - 7.7 K/uL   Lymphocytes Relative 25 %   Lymphs Abs 1.4 0.7 - 4.0 K/uL   Monocytes Relative 10 %   Monocytes Absolute 0.6 0.1 - 1.0 K/uL   Eosinophils Relative 1 %   Eosinophils Absolute 0.1 0.0 - 0.5 K/uL   Basophils Relative 0 %   Basophils Absolute 0.0 0.0 - 0.1 K/uL   Immature Granulocytes 0 %   Abs Immature Granulocytes 0.00 0.00 - 0.07 K/uL    Comment: Performed at [facility] Lab at [facility], [address], [place], [place] [address]  Save Smear [facility])     Status: None   Collection Time: 05/20/21  2:52 PM  Result Value Ref Range   Smear Review SMEAR STAINED AND AVAILABLE FOR REVIEW     Comment: Performed at [facility] at [facility], [address] [place], [place] [address]      RADIOGRAPHY: No results found.     PATHOLOGY: None  ASSESSMENT/PLAN: Ms. Wienke is a very pleasant 81 yo caucasian female with recent history of mild thrombocytopenia.  She remains asymptomatic with this. Platelets are stable at 104.  Blood smear reviewed with Dr. Marin Olp. No abnormality or evidence of malignancy noted.  From our standpoint, she is ok to proceed with heart cath.  We will plan to see her again in 6 month and if everything remains stable at that time we will release her back to her PCP.   All questions were answered. The patient knows to call the clinic with any problems, questions or concerns. We can certainly  see the patient much sooner if necessary.  The patient was discussed with and also seen by Dr. Marin Olp and he is in agreement with the aforementioned.   Lottie Dawson, NP

## 2021-05-22 ENCOUNTER — Telehealth: Payer: Self-pay | Admitting: *Deleted

## 2021-05-22 NOTE — Telephone Encounter (Signed)
Per 05/20/21 los - called and gave upcoming appointments - confirmed

## 2021-05-26 DIAGNOSIS — N390 Urinary tract infection, site not specified: Secondary | ICD-10-CM | POA: Diagnosis not present

## 2021-06-16 DIAGNOSIS — N390 Urinary tract infection, site not specified: Secondary | ICD-10-CM | POA: Diagnosis not present

## 2021-07-07 DIAGNOSIS — I2510 Atherosclerotic heart disease of native coronary artery without angina pectoris: Secondary | ICD-10-CM | POA: Diagnosis not present

## 2021-07-07 NOTE — Progress Notes (Unsigned)
TLX:BWIOMB-TD coronary artery disease. Echocardiogram December 2022 showed normal LV function, moderate basal septal hypertrophy, grade 1 diastolic dysfunction.  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  FFR modeled as total occlusion but no other obstructive disease noted.  Patient was felt to require cardiac catheterization but at the time her platelet count was noted to be decreased at 50,000 and was felt that hematology should see the patient first.  Her platelet count improved to 104,000 and hematology felt she could undergo catheterization and stent placement if necessary.  Since last seen   Current Outpatient Medications  Medication Sig Dispense Refill   ergocalciferol (VITAMIN D2) 1.25 MG (50000 UT) capsule Take by mouth.     FREESTYLE LITE test strip      glipiZIDE (GLUCOTROL) 5 MG tablet Take 5 mg by mouth daily before breakfast.     hydrochlorothiazide (HYDRODIURIL) 25 MG tablet Take 25 mg by mouth daily.     ibuprofen (ADVIL,MOTRIN) 200 MG tablet Take 200 mg by mouth as needed for moderate pain. For pain     levothyroxine (SYNTHROID) 75 MCG tablet Take 75 mcg by mouth daily.     levothyroxine (SYNTHROID, LEVOTHROID) 88 MCG tablet Take 88 mcg by mouth daily. (Patient not taking: Reported on 05/13/2021)     losartan (COZAAR) 100 MG tablet Take 100 mg by mouth daily.     metoprolol tartrate (LOPRESSOR) 100 MG tablet Take 2 hours prior to ct scan (Patient not taking: Reported on 05/13/2021) 1 tablet 0   potassium chloride (KLOR-CON M) 10 MEQ tablet Take 10 mEq by mouth daily.     predniSONE (DELTASONE) 50 MG tablet Take one tablet 13 hours prior to scan, 1 tablet 7 hours prior to scan and 1 tablet 1 hour prior to scan (Patient not taking: Reported on 05/13/2021) 3 tablet 0   rosuvastatin (CRESTOR)  40 MG tablet Take 1 tablet (40 mg total) by mouth daily. 90 tablet 3   No current facility-administered medications for this visit.     Past Medical History:  Diagnosis Date   Diabetes mellitus    Hyperlipemia    Hypertension    Hypothyroidism    Lung nodule     Past Surgical History:  Procedure Laterality Date   ABDOMINAL HYSTERECTOMY  1988   CATARACT EXTRACTION Bilateral    CHOLECYSTECTOMY  1990   TUBAL LIGATION  1974    Social History   Socioeconomic History   Marital status: Married    Spouse name: Not on file   Number of children: 4   Years of education: Not on file   Highest education level: Not on file  Occupational History   Not on file  Tobacco Use   Smoking status: Former    Types: Cigarettes   Smokeless tobacco: Not on file  Substance and Sexual Activity   Alcohol use: Never   Drug use: No   Sexual activity: Not on file  Other Topics Concern   Not on file  Social History Narrative   Not on file   Social Determinants of Health   Financial Resource Strain: Not on file  Food Insecurity: Not on file  Transportation Needs: Not on file  Physical Activity: Not on file  Stress: Not on file  Social Connections: Not on file  Intimate  Partner Violence: Not on file    Family History  Problem Relation Age of Onset   Heart failure Mother    Heart failure Father    CAD Father        Died at age 42 of MI    ROS: no fevers or chills, productive cough, hemoptysis, dysphasia, odynophagia, melena, hematochezia, dysuria, hematuria, rash, seizure activity, orthopnea, PND, pedal edema, claudication. Remaining systems are negative.  Physical Exam: Well-developed well-nourished in no acute distress.  Skin is warm and dry.  HEENT is normal.  Neck is supple.  Chest is clear to auscultation with normal expansion.  Cardiovascular exam is regular rate and rhythm.  Abdominal exam nontender or distended. No masses palpated. Extremities show no edema. neuro  grossly intact  ECG- personally reviewed  A/P  1 coronary artery disease-patient does have dyspnea on exertion and this could be anginal equivalent.  Cardiac CTA as outlined above.  We will plan to proceed with cardiac catheterization.  The risks and benefits including myocardial infarction, CVA and death discussed and she agrees to proceed.  We will continue medical therapy with aspirin and statin.  2 thrombocytopenia-most recent platelet count improved to 104,000.  She was seen by hematology and we may proceed with catheterization/PCI/dual antiplatelet therapy if necessary.  3 hypertension-patient's blood pressure is controlled today.  Continue present medications and follow.  4 hyperlipidemia-continue statin.  Check lipids and liver.  Kirk Ruths, MD

## 2021-07-08 ENCOUNTER — Other Ambulatory Visit: Payer: Self-pay | Admitting: *Deleted

## 2021-07-08 DIAGNOSIS — E7800 Pure hypercholesterolemia, unspecified: Secondary | ICD-10-CM

## 2021-07-08 LAB — HEPATIC FUNCTION PANEL
ALT: 16 IU/L (ref 0–32)
AST: 33 IU/L (ref 0–40)
Albumin: 4 g/dL (ref 3.7–4.7)
Alkaline Phosphatase: 66 IU/L (ref 44–121)
Bilirubin Total: 0.7 mg/dL (ref 0.0–1.2)
Bilirubin, Direct: 0.3 mg/dL (ref 0.00–0.40)
Total Protein: 6.8 g/dL (ref 6.0–8.5)

## 2021-07-08 LAB — LIPID PANEL
Chol/HDL Ratio: 2.7 ratio (ref 0.0–4.4)
Cholesterol, Total: 159 mg/dL (ref 100–199)
HDL: 60 mg/dL (ref >39)
LDL Chol Calc (NIH): 72 mg/dL (ref 0–99)
Triglycerides: 157 mg/dL — ABNORMAL HIGH (ref 0–149)
VLDL Cholesterol Cal: 27 mg/dL (ref 5–40)

## 2021-07-08 MED ORDER — EZETIMIBE 10 MG PO TABS: 10.0000 mg | ORAL_TABLET | Freq: Every day | ORAL | 3 refills | Status: DC

## 2021-07-09 ENCOUNTER — Encounter: Payer: Self-pay | Admitting: Cardiology

## 2021-07-14 ENCOUNTER — Encounter: Payer: Self-pay | Admitting: Cardiology

## 2021-07-14 ENCOUNTER — Other Ambulatory Visit: Payer: Self-pay

## 2021-07-14 ENCOUNTER — Ambulatory Visit (INDEPENDENT_AMBULATORY_CARE_PROVIDER_SITE_OTHER): Payer: Medicare Other | Admitting: Cardiology

## 2021-07-14 VITALS — BP 126/70 | HR 96 | Ht 60.0 in | Wt 156.0 lb

## 2021-07-14 DIAGNOSIS — R0609 Other forms of dyspnea: Secondary | ICD-10-CM

## 2021-07-14 DIAGNOSIS — I2510 Atherosclerotic heart disease of native coronary artery without angina pectoris: Secondary | ICD-10-CM

## 2021-07-14 DIAGNOSIS — I10 Essential (primary) hypertension: Secondary | ICD-10-CM

## 2021-07-14 DIAGNOSIS — E7800 Pure hypercholesterolemia, unspecified: Secondary | ICD-10-CM | POA: Diagnosis not present

## 2021-07-14 MED ORDER — ASPIRIN EC 81 MG PO TBEC
81.0000 mg | DELAYED_RELEASE_TABLET | Freq: Every day | ORAL | 3 refills | Status: AC
Start: 2021-07-14 — End: ?

## 2021-07-14 MED ORDER — METOPROLOL SUCCINATE ER 25 MG PO TB24: 25.0000 mg | ORAL_TABLET | Freq: Every day | ORAL | 3 refills | Status: DC

## 2021-07-14 NOTE — Patient Instructions (Signed)
Medication Instructions:  ? ?START ASPIRIN 81 MG ONCE DAILY ? ?START METOPROLOL SUCC ER 25 MG ONCE DAILY AT BEDTIME ? ?*If you need a refill on your cardiac medications before your next appointment, please call your pharmacy* ? ? ?Follow-Up: ?At [facility], you and your health needs are our priority.  As part of our continuing mission to provide you with exceptional heart care, we have created designated Provider Care Teams.  These Care Teams include your primary Cardiologist (physician) and Advanced Practice Providers (APPs -  Physician Assistants and Nurse Practitioners) who all work together to provide you with the care you need, when you need it. ? ?We recommend signing up for the patient portal called "MyChart".  Sign up information is provided on this After Visit Summary.  MyChart is used to connect with patients for Virtual Visits (Telemedicine).  Patients are able to view lab/test results, encounter notes, upcoming appointments, etc.  Non-urgent messages can be sent to your provider as well.   ?To learn more about what you can do with MyChart, go to [email or web address].   ? ?Your next appointment:   ?6 week(s) ? ?The format for your next appointment:   ?In Person{ ? ?JESSE CLEAVER NP, CALLIE GOODRICH PA-C OR HAO MENG PA-C ?

## 2021-07-21 ENCOUNTER — Ambulatory Visit: Payer: TRICARE For Life (TFL) | Admitting: Cardiology

## 2021-08-20 DIAGNOSIS — I10 Essential (primary) hypertension: Secondary | ICD-10-CM | POA: Diagnosis not present

## 2021-08-20 DIAGNOSIS — D473 Essential (hemorrhagic) thrombocythemia: Secondary | ICD-10-CM | POA: Diagnosis not present

## 2021-08-20 DIAGNOSIS — E119 Type 2 diabetes mellitus without complications: Secondary | ICD-10-CM | POA: Diagnosis not present

## 2021-08-20 DIAGNOSIS — E785 Hyperlipidemia, unspecified: Secondary | ICD-10-CM | POA: Diagnosis not present

## 2021-08-20 DIAGNOSIS — E039 Hypothyroidism, unspecified: Secondary | ICD-10-CM | POA: Diagnosis not present

## 2021-08-20 DIAGNOSIS — D649 Anemia, unspecified: Secondary | ICD-10-CM | POA: Diagnosis not present

## 2021-08-26 ENCOUNTER — Ambulatory Visit: Payer: Medicare Other | Admitting: General Practice

## 2021-09-09 ENCOUNTER — Ambulatory Visit: Payer: Medicare Other | Admitting: Physician Assistant

## 2021-09-18 DIAGNOSIS — D649 Anemia, unspecified: Secondary | ICD-10-CM | POA: Diagnosis not present

## 2021-09-23 ENCOUNTER — Encounter: Payer: Self-pay | Admitting: *Deleted
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
## 2021-09-24 ENCOUNTER — Encounter: Payer: Self-pay | Admitting: Physician Assistant

## 2021-09-24 ENCOUNTER — Ambulatory Visit (INDEPENDENT_AMBULATORY_CARE_PROVIDER_SITE_OTHER): Payer: Medicare Other | Admitting: Physician Assistant

## 2021-09-24 VITALS — BP 112/60 | HR 68 | Ht 60.0 in | Wt 153.8 lb

## 2021-09-24 DIAGNOSIS — I10 Essential (primary) hypertension: Secondary | ICD-10-CM

## 2021-09-24 DIAGNOSIS — D696 Thrombocytopenia, unspecified: Secondary | ICD-10-CM | POA: Diagnosis not present

## 2021-09-24 DIAGNOSIS — I2510 Atherosclerotic heart disease of native coronary artery without angina pectoris: Secondary | ICD-10-CM | POA: Diagnosis not present

## 2021-09-24 DIAGNOSIS — E7800 Pure hypercholesterolemia, unspecified: Secondary | ICD-10-CM | POA: Diagnosis not present

## 2021-09-24 MED ORDER — EZETIMIBE 10 MG PO TABS: 10.0000 mg | ORAL_TABLET | Freq: Every day | ORAL | 3 refills | Status: DC

## 2021-09-24 MED ORDER — NITROGLYCERIN 0.4 MG SL SUBL: 0.4000 mg | SUBLINGUAL_TABLET | SUBLINGUAL | 3 refills | Status: DC | PRN

## 2021-09-24 NOTE — Patient Instructions (Addendum)
Medication Instructions:  TAKE Nitroglycerin as needed for chest pain. DO NOT use more than 3 tablets in an episode *If you need a refill on your cardiac medications before your next appointment, please call your pharmacy*  Lab Work: Lipid Panel  Direct LDL Hepatic Function Test  If you have labs (blood work) drawn today and your tests are completely normal, you will receive your results only by: MyChart Message (if you have MyChart) OR A paper copy in the mail If you have any lab test that is abnormal or we need to change your treatment, we will call you to review the results.  Testing/Procedures: NONE ordered at this time of appointment    Follow-Up: At [facility], you and your health needs are our priority.  As part of our continuing mission to provide you with exceptional heart care, we have created designated Provider Care Teams.  These Care Teams include your primary Cardiologist (physician) and Advanced Practice Providers (APPs -  Physician Assistants and Nurse Practitioners) who all work together to provide you with the care you need, when you need it.   Your next appointment:   2-3 month(s)  The format for your next appointment:   In Person  Provider:   Kirk Ruths, MD  or Fabian Sharp, PA-C        Other Instructions   Important Information About Sugar

## 2021-09-25 LAB — LIPID PANEL
Chol/HDL Ratio: 2.4 ratio (ref 0.0–4.4)
Cholesterol, Total: 116 mg/dL (ref 100–199)
HDL: 49 mg/dL (ref >39)
LDL Chol Calc (NIH): 41 mg/dL (ref 0–99)
Triglycerides: 156 mg/dL — ABNORMAL HIGH (ref 0–149)
VLDL Cholesterol Cal: 26 mg/dL (ref 5–40)

## 2021-09-25 LAB — HEPATIC FUNCTION PANEL
ALT: 26 IU/L (ref 0–32)
AST: 45 IU/L — ABNORMAL HIGH (ref 0–40)
Albumin: 4 g/dL (ref 3.7–4.7)
Alkaline Phosphatase: 59 IU/L (ref 44–121)
Bilirubin Total: 0.6 mg/dL (ref 0.0–1.2)
Bilirubin, Direct: 0.24 mg/dL (ref 0.00–0.40)
Total Protein: 6.7 g/dL (ref 6.0–8.5)

## 2021-09-25 LAB — LDL CHOLESTEROL, DIRECT: LDL Direct: 39 mg/dL (ref 0–99)

## 2021-11-17 ENCOUNTER — Inpatient Hospital Stay: Payer: Medicare Other

## 2021-11-17 ENCOUNTER — Inpatient Hospital Stay: Payer: Medicare Other | Admitting: Family

## 2021-11-18 ENCOUNTER — Telehealth: Payer: Self-pay | Admitting: Cardiology

## 2021-11-18 NOTE — Telephone Encounter (Signed)
Pt is calling because she states she is supposed to have a heart cath and wants to know when its supposed to occur. She states she has to make arrangements for it. Please advise.

## 2021-11-18 NOTE — Telephone Encounter (Signed)
Called patient, LVM to call back to discuss.   Patient needs her visit with PA on 08/10- to discuss CATH, updated H&P, updated labs, and will get scheduled during this visit if she wishes- per last OV from [place], [place].

## 2021-11-21 DIAGNOSIS — I10 Essential (primary) hypertension: Secondary | ICD-10-CM | POA: Diagnosis not present

## 2021-11-21 DIAGNOSIS — G63 Polyneuropathy in diseases classified elsewhere: Secondary | ICD-10-CM | POA: Diagnosis not present

## 2021-11-21 DIAGNOSIS — E1169 Type 2 diabetes mellitus with other specified complication: Secondary | ICD-10-CM | POA: Diagnosis not present

## 2021-11-21 DIAGNOSIS — N183 Chronic kidney disease, stage 3 unspecified: Secondary | ICD-10-CM | POA: Diagnosis not present

## 2021-11-21 DIAGNOSIS — E785 Hyperlipidemia, unspecified: Secondary | ICD-10-CM | POA: Diagnosis not present

## 2021-11-21 NOTE — Telephone Encounter (Signed)
Spoke with pt, she is aware of the appointment and that the cath will be scheduled at that time.
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
## 2021-11-27 ENCOUNTER — Encounter: Payer: Self-pay | Admitting: *Deleted

## 2021-12-04 ENCOUNTER — Encounter: Payer: Self-pay | Admitting: Physician Assistant

## 2021-12-04 ENCOUNTER — Ambulatory Visit (INDEPENDENT_AMBULATORY_CARE_PROVIDER_SITE_OTHER): Payer: Medicare Other | Admitting: Physician Assistant

## 2021-12-04 VITALS — BP 102/66 | HR 65 | Ht 60.0 in | Wt 154.4 lb

## 2021-12-04 DIAGNOSIS — I2510 Atherosclerotic heart disease of native coronary artery without angina pectoris: Secondary | ICD-10-CM | POA: Diagnosis not present

## 2021-12-04 DIAGNOSIS — D696 Thrombocytopenia, unspecified: Secondary | ICD-10-CM | POA: Diagnosis not present

## 2021-12-04 DIAGNOSIS — E7800 Pure hypercholesterolemia, unspecified: Secondary | ICD-10-CM | POA: Diagnosis not present

## 2021-12-04 DIAGNOSIS — E118 Type 2 diabetes mellitus with unspecified complications: Secondary | ICD-10-CM | POA: Diagnosis not present

## 2021-12-04 DIAGNOSIS — I10 Essential (primary) hypertension: Secondary | ICD-10-CM | POA: Diagnosis not present

## 2021-12-04 DIAGNOSIS — N182 Chronic kidney disease, stage 2 (mild): Secondary | ICD-10-CM | POA: Diagnosis not present

## 2021-12-04 MED ORDER — DIPHENHYDRAMINE HCL 50 MG PO TABS: 50.0000 mg | ORAL_TABLET | Freq: Once | ORAL | 0 refills | Status: DC

## 2021-12-04 MED ORDER — PREDNISONE 50 MG PO TABS: ORAL_TABLET | ORAL | 0 refills | Status: DC

## 2021-12-04 NOTE — Patient Instructions (Addendum)
Medication Instructions:  No Changes *If you need a refill on your cardiac medications before your next appointment, please call your pharmacy*   Lab Work: BMET, CBC Today If you have labs (blood work) drawn today and your tests are completely normal, you will receive your results only by: [place] (if you have MyChart) OR A paper copy in the mail If you have any lab test that is abnormal or we need to change your treatment, we will call you to review the results.      Follow-Up: At [facility], you and your health needs are our priority.  As part of our continuing mission to provide you with exceptional heart care, we have created designated Provider Care Teams.  These Care Teams include your primary Cardiologist (physician) and Advanced Practice Providers (APPs -  Physician Assistants and Nurse Practitioners) who all work together to provide you with the care you need, when you need it.  [email or web address].    Your next appointment:   1 week(s) Post Cath  The format for your next appointment:   In Person  Provider:   First Available APP  If primary card or EP is not listed click here to update    :1}    Other Instructions  Victoria Ferguson [id number] Dept: [place]: [place]  12/04/2021  You are scheduled for a Cardiac Catheterization on Wednesday, August 23 with Dr. Larae Grooms.  1. Please arrive at the Main Entrance A at [facility]: [place], [place] [address] at 6:30 AM (This time is two hours before your procedure to ensure your preparation). Free valet parking service is available.   Special note: Every effort is made to have your procedure done on time. Please understand that emergencies sometimes delay scheduled procedures.  2. Diet: Do not eat solid foods after midnight.  You may have  clear liquids until 5 AM upon the day of the procedure.  3. Labs: You will need to have blood drawn on Thursday, August 10 at [place]  Open: 8am - 5pm (Lunch 12:30 - 1:30)   Phone: [phone]. You do not need to be fasting.  4. Medication instructions in preparation for your procedure:   Contrast Allergy: Yes, Please take Prednisone '50mg'$  by mouth at: Thirteen hours prior to cath 7:00pm on Tuesday Seven hours prior to cath 11:00pm on Tuesday And prior to leaving home please take last dose of Prednisone '50mg'$  and Benadryl '50mg'$  by mouth. 6:00 AM    Current Outpatient Medications (Endocrine & Metabolic):    glipiZIDE (GLUCOTROL) 5 MG tablet, Take 5 mg by mouth daily before breakfast.   levothyroxine (SYNTHROID) 75 MCG tablet, Take 75 mcg by mouth daily.  Current Outpatient Medications (Cardiovascular):    ezetimibe (ZETIA) 10 MG tablet, Take 1 tablet (10 mg total) by mouth daily.   hydrochlorothiazide (HYDRODIURIL) 25 MG tablet, Take 25 mg by mouth daily.   losartan (COZAAR) 100 MG tablet, Take 50 mg by mouth daily.   metoprolol succinate (TOPROL XL) 25 MG 24 hr tablet, Take 1 tablet (25 mg total) by mouth daily.   nitroGLYCERIN (NITROSTAT) 0.4 MG SL tablet, Place 1 tablet (0.4 mg total) under the tongue every 5 (five) minutes as needed for chest pain.   rosuvastatin (CRESTOR) 40 MG tablet, Take 1 tablet (40 mg total) by mouth daily.   Current  Outpatient Medications (Analgesics):    aspirin EC 81 MG tablet, Take 1 tablet (81 mg total) by mouth daily. Swallow whole.   ibuprofen (ADVIL,MOTRIN) 200 MG tablet, Take 200 mg by mouth as needed for moderate pain. For pain   Current Outpatient Medications (Other):    ergocalciferol (VITAMIN D2) 1.25 MG (50000 UT) capsule, Take by mouth.   FREESTYLE LITE test strip,    potassium chloride (KLOR-CON M) 10 MEQ tablet, Take 10 mEq by mouth daily. *For reference purposes while preparing patient  instructions.   Delete this med list prior to printing instructions for patient.*  N?A  Stop taking, HCTZ and Losartan Monday, December 15, 2021  N/A  Glucotrol Day Before Procedure  On the morning of your procedure, take Aspirin and any morning medicines NOT listed above.  You may use sips of water.  5. Plan to go home the same day, you will only stay overnight if medically necessary. 6. You MUST have a responsible adult to drive you home. 7. An adult MUST be with you the first 24 hours after you arrive home. 8. Bring a current list of your medications, and the last time and date medication taken. 9. Bring ID and current insurance cards. 10.Please wear clothes that are easy to get on and off and wear slip-on shoes.  Thank you for allowing [place] to care for you!   -- [facility]   Important Information About Sugar

## 2021-12-05 ENCOUNTER — Telehealth: Payer: Self-pay

## 2021-12-05 ENCOUNTER — Telehealth: Payer: Self-pay | Admitting: Physician Assistant

## 2021-12-05 LAB — BASIC METABOLIC PANEL
BUN/Creatinine Ratio: 18 (ref 12–28)
BUN: 22 mg/dL (ref 8–27)
CO2: 23 mmol/L (ref 20–29)
Calcium: 10 mg/dL (ref 8.7–10.3)
Chloride: 101 mmol/L (ref 96–106)
Creatinine, Ser: 1.23 mg/dL — ABNORMAL HIGH (ref 0.57–1.00)
Glucose: 64 mg/dL — ABNORMAL LOW (ref 70–99)
Potassium: 3.9 mmol/L (ref 3.5–5.2)
Sodium: 141 mmol/L (ref 134–144)
eGFR: 44 mL/min/{1.73_m2} — ABNORMAL LOW (ref >59)

## 2021-12-05 LAB — CBC
Hematocrit: 39.4 % (ref 34.0–46.6)
Hemoglobin: 12 g/dL (ref 11.1–15.9)
MCH: 26.9 pg (ref 26.6–33.0)
MCHC: 30.5 g/dL — ABNORMAL LOW (ref 31.5–35.7)
MCV: 88 fL (ref 79–97)
Platelets: 108 10*3/uL — ABNORMAL LOW (ref 150–450)
RBC: 4.46 x10E6/uL (ref 3.77–5.28)
RDW: 14.9 % (ref 11.7–15.4)
WBC: 5.2 10*3/uL (ref 3.4–10.8)

## 2021-12-05 NOTE — Telephone Encounter (Addendum)
Called patient regarding scheduled cath. Left message for patient to call office.----- Message from Victoria Ferguson, [place] sent at 12/04/2021  7:01 PM EDT ----- Dr. Stanford Breed would like this patient to come in early for iStat and hydration pre-cath. Can you please move her to later in the cath schedule?  Thanks Angie

## 2021-12-05 NOTE — Telephone Encounter (Signed)
Spoke to patient's son Herbie [place] he was calling to get clarification about appointment yesterday.Advised I am not able to discuss her health information with you.Advised you are not on her DPR.I will call patient to get permission to talk to you.

## 2021-12-05 NOTE — Telephone Encounter (Signed)
Called patient left message on personal voice mail to call back to give permission to speak to your son.

## 2021-12-05 NOTE — Telephone Encounter (Signed)
Pt son called stating that pt has some concerns and questions about yesterday's visit that they would like to discuss.

## 2021-12-05 NOTE — Telephone Encounter (Signed)
Spoke to patient she gave permission to talk to her son Herbie [place]. Spoke to [place] he requested cath be moved to [place] 8/25 if possible.Stated he lives out of town and would be better for him. Spoke to [place] at cath lab.Cath moved to Friday 8/25 at 10:30 am with Dr.Harding.Arrive at 7:30 am for IV hydration.Advised to follow instructions already given.I will make Fabian Sharp PA aware.

## 2021-12-05 NOTE — Telephone Encounter (Signed)
Patient returned call regarding heart Cath. Advised patient will need to arrive day of Cath to Hospital at 5:30 AM to be given increased fluids for procedure.

## 2021-12-17 ENCOUNTER — Telehealth: Payer: Self-pay | Admitting: *Deleted

## 2021-12-17 DIAGNOSIS — R072 Precordial pain: Secondary | ICD-10-CM

## 2021-12-17 NOTE — Telephone Encounter (Addendum)
Cardiac Catheterization scheduled at [facility] for: Friday December 19, 2021 10:30 AM Arrival time and place: [place] Entrance A at: 5:30 AM-pre-procedure hydration   Nothing to eat after midnight prior to procedure, clear liquids until 5 AM day of procedure.  CONTRAST ALLERGY: 13 hour Prednisone and Benadryl Prep reviewed with patient: 12/18/21 Prednisone 50 mg 9:30 PM 12/19/21 Prednisone 50 mg 3:30 AM 12/19/21 Prednisone 50 mg and Benadryl 50 mg -pt will take with her to hospital and take at 8:30 AM at hospital. I have asked her to let nursing staff know she has these medications with her to take at 8:30 AM.  Medication instructions: -Hold:  HCTZ/Losartan/KCl/Ibuprofen-day before and day of procedure -per protocol GFR 44  Glipizide-AM of procedure  -Except hold medications usual morning medications can be taken with sips of water including aspirin 81 mg.  Confirmed patient has responsible adult to drive home post procedure and be with patient first 24 hours after arriving home.  Patient reports no new symptoms concerning for COVID-19 in the past 10 days.  Reviewed procedure instructions, pre-procedure hydration and arrival time for hydration with patient.

## 2021-12-19 ENCOUNTER — Encounter (HOSPITAL_COMMUNITY)
Admission: RE | Disposition: A | Discharge status: Home / Self Care | Payer: Self-pay | Source: Home / Self Care | Attending: Cardiology

## 2021-12-19 ENCOUNTER — Ambulatory Visit (HOSPITAL_COMMUNITY)
Admission: RE | Admit: 2021-12-19 | Discharge: 2021-12-19 | Disposition: A | Discharge status: Home / Self Care | Payer: Medicare Other | Attending: Cardiology | Admitting: Cardiology

## 2021-12-19 ENCOUNTER — Other Ambulatory Visit: Payer: Self-pay

## 2021-12-19 DIAGNOSIS — I129 Hypertensive chronic kidney disease with stage 1 through stage 4 chronic kidney disease, or unspecified chronic kidney disease: Secondary | ICD-10-CM | POA: Insufficient documentation

## 2021-12-19 DIAGNOSIS — E1122 Type 2 diabetes mellitus with diabetic chronic kidney disease: Secondary | ICD-10-CM | POA: Diagnosis not present

## 2021-12-19 DIAGNOSIS — D696 Thrombocytopenia, unspecified: Secondary | ICD-10-CM | POA: Insufficient documentation

## 2021-12-19 DIAGNOSIS — Z79899 Other long term (current) drug therapy: Secondary | ICD-10-CM | POA: Diagnosis not present

## 2021-12-19 DIAGNOSIS — I25110 Atherosclerotic heart disease of native coronary artery with unstable angina pectoris: Secondary | ICD-10-CM

## 2021-12-19 DIAGNOSIS — N182 Chronic kidney disease, stage 2 (mild): Secondary | ICD-10-CM | POA: Insufficient documentation

## 2021-12-19 DIAGNOSIS — R072 Precordial pain: Secondary | ICD-10-CM

## 2021-12-19 DIAGNOSIS — I2584 Coronary atherosclerosis due to calcified coronary lesion: Secondary | ICD-10-CM | POA: Diagnosis not present

## 2021-12-19 DIAGNOSIS — E7800 Pure hypercholesterolemia, unspecified: Secondary | ICD-10-CM | POA: Insufficient documentation

## 2021-12-19 DIAGNOSIS — Z87891 Personal history of nicotine dependence: Secondary | ICD-10-CM | POA: Diagnosis not present

## 2021-12-19 DIAGNOSIS — R931 Abnormal findings on diagnostic imaging of heart and coronary circulation: Secondary | ICD-10-CM

## 2021-12-19 DIAGNOSIS — Z7984 Long term (current) use of oral hypoglycemic drugs: Secondary | ICD-10-CM | POA: Diagnosis not present

## 2021-12-19 DIAGNOSIS — I2582 Chronic total occlusion of coronary artery: Secondary | ICD-10-CM | POA: Diagnosis not present

## 2021-12-19 HISTORY — PX: LEFT HEART CATH AND CORONARY ANGIOGRAPHY: CATH118249

## 2021-12-19 LAB — GLUCOSE, CAPILLARY
Glucose-Capillary: 188 mg/dL — ABNORMAL HIGH (ref 70–99)
Glucose-Capillary: 145 mg/dL — ABNORMAL HIGH (ref 70–99)

## 2021-12-19 SURGERY — LEFT HEART CATH AND CORONARY ANGIOGRAPHY
Anesthesia: LOCAL

## 2021-12-19 MED ORDER — MIDAZOLAM HCL 2 MG/2ML IJ SOLN
INTRAMUSCULAR | Status: DC | PRN
  Administered 2021-12-19: 1 mg via INTRAVENOUS

## 2021-12-19 MED ORDER — ONDANSETRON HCL 4 MG/2ML IJ SOLN: 4.0000 mg | Freq: Four times a day (QID) | INTRAMUSCULAR | Status: DC | PRN

## 2021-12-19 MED ORDER — SODIUM CHLORIDE 0.9 % IV SOLN: 250.0000 mL | INTRAVENOUS | Status: DC | PRN

## 2021-12-19 MED ORDER — SODIUM CHLORIDE 0.9% FLUSH
3.0000 mL | Freq: Two times a day (BID) | INTRAVENOUS | Status: DC
Start: 2021-12-20 — End: 2021-12-19

## 2021-12-19 MED ORDER — SODIUM CHLORIDE 0.9 % WEIGHT BASED INFUSION
1.0000 mL/kg/h | INTRAVENOUS | Status: DC
Start: 2021-12-19 — End: 2021-12-19

## 2021-12-19 MED ORDER — SODIUM CHLORIDE 0.9% FLUSH: 3.0000 mL | INTRAVENOUS | Status: DC | PRN

## 2021-12-19 MED ORDER — HEPARIN SODIUM (PORCINE) 1000 UNIT/ML IJ SOLN
INTRAMUSCULAR | Status: AC
  Filled 2021-12-19: qty 10

## 2021-12-19 MED ORDER — SODIUM CHLORIDE 0.9 % WEIGHT BASED INFUSION
3.0000 mL/kg/h | INTRAVENOUS | Status: AC
  Administered 2021-12-19: 3 mL/kg/h via INTRAVENOUS

## 2021-12-19 MED ORDER — FENTANYL CITRATE (PF) 100 MCG/2ML IJ SOLN
INTRAMUSCULAR | Status: DC | PRN
  Administered 2021-12-19 (×2): 25 ug via INTRAVENOUS

## 2021-12-19 MED ORDER — FENTANYL CITRATE (PF) 100 MCG/2ML IJ SOLN
INTRAMUSCULAR | Status: AC
  Filled 2021-12-19: qty 2

## 2021-12-19 MED ORDER — LIDOCAINE HCL (PF) 1 % IJ SOLN
INTRAMUSCULAR | Status: DC | PRN
  Administered 2021-12-19: 2 mL

## 2021-12-19 MED ORDER — ASPIRIN 81 MG PO CHEW: 81.0000 mg | CHEWABLE_TABLET | ORAL | Status: DC

## 2021-12-19 MED ORDER — HEPARIN (PORCINE) IN NACL 1000-0.9 UT/500ML-% IV SOLN
INTRAVENOUS | Status: AC
  Filled 2021-12-19: qty 1000

## 2021-12-19 MED ORDER — LIDOCAINE HCL (PF) 1 % IJ SOLN
INTRAMUSCULAR | Status: AC
  Filled 2021-12-19: qty 30

## 2021-12-19 MED ORDER — HEPARIN (PORCINE) IN NACL 1000-0.9 UT/500ML-% IV SOLN
INTRAVENOUS | Status: DC | PRN
  Administered 2021-12-19 (×2): 500 mL

## 2021-12-19 MED ORDER — HEPARIN SODIUM (PORCINE) 1000 UNIT/ML IJ SOLN
INTRAMUSCULAR | Status: DC | PRN
  Administered 2021-12-19: 3500 [IU] via INTRAVENOUS

## 2021-12-19 MED ORDER — VERAPAMIL HCL 2.5 MG/ML IV SOLN
INTRAVENOUS | Status: DC | PRN
  Administered 2021-12-19: 10 mL via INTRA_ARTERIAL

## 2021-12-19 MED ORDER — METOPROLOL TARTRATE 5 MG/5ML IV SOLN
INTRAVENOUS | Status: AC
  Filled 2021-12-19: qty 5

## 2021-12-19 MED ORDER — MIDAZOLAM HCL 2 MG/2ML IJ SOLN
INTRAMUSCULAR | Status: AC
  Filled 2021-12-19: qty 2

## 2021-12-19 MED ORDER — METOPROLOL TARTRATE 5 MG/5ML IV SOLN
INTRAVENOUS | Status: DC | PRN
  Administered 2021-12-19: 5 mg via INTRAVENOUS

## 2021-12-19 MED ORDER — LABETALOL HCL 5 MG/ML IV SOLN: 10.0000 mg | INTRAVENOUS | Status: DC | PRN

## 2021-12-19 MED ORDER — ACETAMINOPHEN 325 MG PO TABS: 650.0000 mg | ORAL_TABLET | ORAL | Status: DC | PRN

## 2021-12-19 MED ORDER — SODIUM CHLORIDE 0.9% FLUSH: 3.0000 mL | Freq: Two times a day (BID) | INTRAVENOUS | Status: DC

## 2021-12-19 MED ORDER — IOHEXOL 350 MG/ML SOLN
INTRAVENOUS | Status: DC | PRN
  Administered 2021-12-19: 100 mL

## 2021-12-19 MED ORDER — HYDRALAZINE HCL 20 MG/ML IJ SOLN: 10.0000 mg | INTRAMUSCULAR | Status: DC | PRN

## 2021-12-19 MED ORDER — VERAPAMIL HCL 2.5 MG/ML IV SOLN
INTRAVENOUS | Status: AC
  Filled 2021-12-19: qty 2

## 2021-12-19 MED ORDER — SODIUM CHLORIDE 0.9 % WEIGHT BASED INFUSION
1.0000 mL/kg/h | INTRAVENOUS | Status: DC
  Administered 2021-12-19: 1 mL/kg/h via INTRAVENOUS

## 2021-12-19 SURGICAL SUPPLY — 12 items
BAND ZEPHYR COMPRESS 30 LONG (HEMOSTASIS) IMPLANT
CATH LAUNCHER 5F EBU3.5 (CATHETERS) IMPLANT
CATH OPTITORQUE TIG 4.0 5F (CATHETERS) IMPLANT
GLIDESHEATH SLEND SS 6F .021 (SHEATH) IMPLANT
GUIDEWIRE INQWIRE 1.5J.035X260 (WIRE) IMPLANT
INQWIRE 1.5J .035X260CM (WIRE) ×1
KIT HEART LEFT (KITS) ×1 IMPLANT
PACK CARDIAC CATHETERIZATION (CUSTOM PROCEDURE TRAY) ×1 IMPLANT
SHEATH PROBE COVER 6X72 (BAG) IMPLANT
TRANSDUCER W/STOPCOCK (MISCELLANEOUS) ×1 IMPLANT
TUBING CIL FLEX 10 FLL-RA (TUBING) ×1 IMPLANT
WIRE HI TORQ VERSACORE-J 145CM (WIRE) IMPLANT

## 2021-12-19 NOTE — Interval H&P Note (Signed)
History and Physical Interval Note:  12/19/2021 1:06 PM  Victoria Ferguson  has presented today for surgery, with the diagnosis of cad - unstable angina/ abnormal coronary CTA    The various methods of treatment have been discussed with the patient and family. After consideration of risks, benefits and other options for treatment, the patient has consented to  Procedure(s): LEFT HEART CATH AND CORONARY ANGIOGRAPHY (N/A)  [place]  as a surgical intervention.  The patient's history has been reviewed, patient examined, no change in status, stable for surgery.  I have reviewed the patient's chart and labs.  Questions were answered to the patient's satisfaction.    Cath Lab Visit (complete for each Cath Lab visit)  Clinical Evaluation Leading to the Procedure:   ACS: No.  Non-ACS:    Anginal Classification: CCS III  Anti-ischemic medical therapy: Minimal Therapy (1 class of medications)  Non-Invasive Test Results: High-risk stress test findings: cardiac mortality >3%/year  Prior CABG: No previous CABG     Glenetta Hew

## 2021-12-22 ENCOUNTER — Encounter (HOSPITAL_COMMUNITY): Payer: Self-pay | Admitting: Cardiology

## 2021-12-24 ENCOUNTER — Ambulatory Visit: Payer: Medicare Other | Admitting: General Practice

## 2021-12-30 NOTE — Progress Notes (Signed)
Cardiology Office Note:    Date:  01/01/2022   ID:  Victoria Ferguson, DOB 23-Nov-1940, MRN [id number]  PCP:  Aura Dials, [facility] Cardiologist:  Kirk Ruths, MD Cardiology APP:  Ledora Bottcher, PA { Referring MD: Aura Dials, MD   Chief Complaint  Patient presents with   Follow-up    Post cath    History of Present Illness:    Victoria Ferguson is a 81 y.o. female with a hx of CAD, hypertension, hyperlipidemia.  He had a cardiac CTA December 2022 with elevated calcium score 2610 placing him in the 98th percentile with 25 to 49% left main, 50 to 69% LCx, occluded mid LAD with collaterals, 7090% RCA and PFO.  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  Interestingly FFR showed total occlusion but no other obstructive disease.  She was recommended for cardiac catheterization; however, platelet count had decreased to 50,000.  Hematology was consulted and platelets improved.  In discussion with Dr. Stanford Breed in March 2023, she preferred to wait until May for definitive angiography as she was moving.  I saw her in clinic 09/24/2021 to discuss definitive angiography.  Due to several stressful life events, she wanted to defer heart catheterization. She eventually had heart cath 12/19/21 which showed CTO of LAD with collaterals and moderate disease in the RCA. Medical therapy was recommended.   She presents today for post cath follow-up. No chest pain, shortness of breath, or orthopnea. She is slightly fatigued since heart cath. She is interested in cardiac rehab. She is on a good medication regimen. She does have some lower extremity edema, LVEDP mildly up during heart cath. I discussed additional diuretic, she does not want to do this now. I discussed low salt diet - she is using a lot of salt with cooking.    Past Medical History:  Diagnosis Date   Diabetes mellitus    Hyperlipemia    Hypertension    Hypothyroidism    Lung nodule     Past Surgical History:   Procedure Laterality Date   ABDOMINAL HYSTERECTOMY  1988   CATARACT EXTRACTION Bilateral    CHOLECYSTECTOMY  1990   LEFT HEART CATH AND CORONARY ANGIOGRAPHY N/A 12/19/2021   Procedure: LEFT HEART CATH AND CORONARY ANGIOGRAPHY;  Surgeon: Leonie Man, MD;  Location: [facility];  Service: Cardiovascular;  Laterality: N/A;   TUBAL LIGATION  1974    Current Medications: Current Meds  Medication Sig   aspirin EC 81 MG tablet Take 1 tablet (81 mg total) by mouth daily. Swallow whole. (Patient taking differently: Take 81 mg by mouth at bedtime. Swallow whole.)   ergocalciferol (VITAMIN D2) 1.25 MG (50000 UT) capsule Take 50,000 Units by mouth every Monday. WITH LUNCH   ezetimibe (ZETIA) 10 MG tablet Take 1 tablet (10 mg total) by mouth daily. (Patient taking differently: Take 10 mg by mouth daily with lunch.)   ferrous sulfate 325 (65 FE) MG tablet Take 325 mg by mouth every 3 (three) days.   FREESTYLE LITE test strip    glipiZIDE (GLUCOTROL) 5 MG tablet Take 5 mg by mouth daily with lunch.   hydrochlorothiazide (HYDRODIURIL) 25 MG tablet Take 25 mg by mouth daily with lunch.   levothyroxine (SYNTHROID) 75 MCG tablet Take 75 mcg by mouth daily.   losartan (COZAAR) 50 MG tablet Take 50 mg by mouth daily with lunch.   metoprolol succinate (TOPROL XL) 25 MG 24 hr tablet Take 1 tablet (25 mg  total) by mouth daily. (Patient taking differently: Take 25 mg by mouth at bedtime.)   nitroGLYCERIN (NITROSTAT) 0.4 MG SL tablet Place 1 tablet (0.4 mg total) under the tongue every 5 (five) minutes as needed for chest pain.   potassium chloride (KLOR-CON M) 10 MEQ tablet Take 10 mEq by mouth daily with lunch.   rosuvastatin (CRESTOR) 40 MG tablet Take 1 tablet (40 mg total) by mouth daily. (Patient taking differently: Take 40 mg by mouth at bedtime.)     Allergies:   Iohexol, Cat hair extract, Tetanus-diphth-acell pertussis, Latex, and Tape   Social History   Socioeconomic History   Marital  status: Married    Spouse name: Not on file   Number of children: 4   Years of education: Not on file   Highest education level: Not on file  Occupational History   Not on file  Tobacco Use   Smoking status: Former    Types: Cigarettes   Smokeless tobacco: Not on file  Substance and Sexual Activity   Alcohol use: Never   Drug use: No   Sexual activity: Not on file  Other Topics Concern   Not on file  Social History Narrative   Not on file   Social Determinants of Health   Financial Resource Strain: Not on file  Food Insecurity: Not on file  Transportation Needs: Not on file  Physical Activity: Not on file  Stress: Not on file  Social Connections: Not on file     Family History: The patient's family history includes CAD in her father; Heart failure in her father and mother.  ROS:   Please see the history of present illness.     All other systems reviewed and are negative.  EKGs/Labs/Other Studies Reviewed:    The following studies were reviewed today:  Left heart cath 12/19/21:   Prox LAD to Mid LAD lesion is 100% stenosed.   Dist Cx lesion is 40% stenosed.  1st Mrg lesion is 45% stenosed.   Prox RCA-1 lesion is 40% stenosed.   Prox RCA-2 lesion is 60% stenosed. ->  Most likely potential PCI target if indicated.   Prox RCA to Mid RCA lesion is 50% stenosed.   There is hyperdynamic left ventricular systolic function.  The left ventricular ejection fraction is greater than 65% by visual estimate.   LV end diastolic pressure is mildly elevated.   There is no aortic valve stenosis.   POST CATH DIAGNOSES Severe CAD: 100% flush CTO of the LAD after septal perforator.  After 20+ mm segment of occlusion, vessel fills via L-L collaterals from distal LCx-OM, and R-L collaterals from distal PDA-distal LAD and septal perforators to the LAD perforators. RCA has diffuse 40-60 and 50% stenoses in the proximal to mid segment.  (Per CTA, distal vessel FFR was borderline at 0.8, but  mid vessel was only 0.5). Large-caliber codominant LCx with very large OM 1 that bifurcates (30% stenosis with OM 2-LPL 1 with ostial 40%. Hyperdynamic LV function EF > 65%, and minimally elevated LVEDP.       RECOMMENDATIONS Discharge home with plans for medical management.  Consider titrating up beta-blocker dose and potentially adding long-acting nitrate, amlodipine or Ranexa. If unable to control symptoms with medications, could consider segmental PCI of the RCA as this was the only vessel that was borderline FFR positive.  The diffuse moderate disease would likely require an extensive amount of stenting. LAD is flush occluded did not likely to be a candidate for CTO  PCI..     Echo 04/08/19: 1. Left ventricular ejection fraction, by estimation, is 65 to 70%. The  left ventricle has normal function. The left ventricle has no regional  wall motion abnormalities. There is moderate asymmetric left ventricular  hypertrophy of the basal-septal  segment. Left ventricular diastolic parameters are consistent with Grade I  diastolic dysfunction (impaired relaxation).   2. Right ventricular systolic function is normal. The right ventricular  size is normal.   3. The mitral valve is normal in structure. Trivial mitral valve  regurgitation.   4. The aortic valve is tricuspid. There is mild calcification of the  aortic valve. There is mild thickening of the aortic valve. Aortic valve  regurgitation is not visualized. Aortic valve sclerosis/calcification is  present, without any evidence of  aortic stenosis.   5. The inferior vena cava is normal in size with greater than 50%  respiratory variability, suggesting right atrial pressure of 3 mmHg.    EKG:  EKG is not ordered today.    Recent Labs: 09/24/2021: ALT 26 12/04/2021: BUN 22; Creatinine, Ser 1.23; Hemoglobin 12.0; Platelets 108; Potassium 3.9; Sodium 141  Recent Lipid Panel    Component Value Date/Time   CHOL 116 09/24/2021 1605    TRIG 156 (H) 09/24/2021 1605   HDL 49 09/24/2021 1605   CHOLHDL 2.4 09/24/2021 1605   LDLCALC 41 09/24/2021 1605   LDLDIRECT 39 09/24/2021 1605     Risk Assessment/Calculations:                Physical Exam:    VS:  BP (!) 130/58   Pulse 61   Ht 5' (1.524 m)   Wt 152 lb (68.9 kg)   SpO2 97%   BMI 29.69 kg/m     Wt Readings from Last 3 Encounters:  01/01/22 152 lb (68.9 kg)  12/19/21 154 lb (69.9 kg)  12/04/21 154 lb 6.4 oz (70 kg)     GEN:  Well nourished, well developed in no acute distress HEENT: Normal NECK: No JVD; No carotid bruits LYMPHATICS: No lymphadenopathy CARDIAC: RRR, no murmurs, rubs, gallops RESPIRATORY:  Clear to auscultation without rales, wheezing or rhonchi  ABDOMEN: Soft, non-tender, non-distended MUSCULOSKELETAL:  No edema; No deformity  SKIN: Warm and dry NEUROLOGIC:  Alert and oriented x 3 PSYCHIATRIC:  Normal affect  Right radial cath site C/D/I  ASSESSMENT:    1. Precordial pain   2. Coronary artery disease involving native coronary artery of native heart without angina pectoris   3. Thrombocytopenia ([place])   4. Essential hypertension   5. Pure hypercholesterolemia   6. Stage 2 chronic kidney disease    PLAN:    In order of problems listed above:  CAD CTA coronary concerning for LAD disease GDMT includes aspirin, Toprol, statin Left heart catheterization with CTO of LAD with collaterals, no PCI. Medical therapy recommended with titration of long acting nitrate, amlodipine, or ranexa. She is not having chest pain at this time. Will hold off on additional medication.    Thrombocytopenia Platelet count improved to 108k   Hypertension PTA Toprol 25 mg, 50 mg losartan, 25 mg HCTZ BP controlled   Hyperlipidemia with LDL goal < 70 She is on 40 mg rosuvastatin and 10 mg Zetia 09/24/2021: Cholesterol, Total 116; HDL 49; LDL Chol Calc (NIH) 41; Triglycerides 156   Follow up in 6 months.      Medication Adjustments/Labs and  Tests Ordered: Current medicines are reviewed at length with the patient today.  Concerns  regarding medicines are outlined above.  Orders Placed This Encounter  Procedures   AMB referral to cardiac rehabilitation   No orders of the defined types were placed in this encounter.   Patient Instructions  Medication Instructions:  Your physician recommends that you continue on your current medications as directed. Please refer to the Current Medication list given to you today.  *If you need a refill on your cardiac medications before your next appointment, please call your pharmacy*   Lab Work: None ordered this visit. Please have your kidney function checked with your primary care physician.  If you have labs (blood work) drawn today and your tests are completely normal, you will receive your results only by: [place] (if you have MyChart) OR A paper copy in the mail If you have any lab test that is abnormal or we need to change your treatment, we will call you to review the results.   Testing/Procedures: NONE   Follow-Up: At [facility], you and your health needs are our priority.  As part of our continuing mission to provide you with exceptional heart care, we have created designated Provider Care Teams.  These Care Teams include your primary Cardiologist (physician) and Advanced Practice Providers (APPs -  Physician Assistants and Nurse Practitioners) who all work together to provide you with the care you need, when you need it.  We recommend signing up for the patient portal called "MyChart".  Sign up information is provided on this After Visit Summary.  MyChart is used to connect with patients for Virtual Visits (Telemedicine).  Patients are able to view lab/test results, encounter notes, upcoming appointments, etc.  Non-urgent messages can be sent to your provider as well.   To learn more about what you can do with MyChart, go to [email or web address].    Your  next appointment:   6 month(s)  The format for your next appointment:   In Person  Provider:   Kirk Ruths, MD      Signed, [place], PA  01/01/2022 3:12 PM    [place]

## 2022-01-01 ENCOUNTER — Ambulatory Visit: Payer: Medicare Other | Attending: General Practice | Admitting: Physician Assistant

## 2022-01-01 ENCOUNTER — Encounter: Payer: Self-pay | Admitting: Physician Assistant

## 2022-01-01 VITALS — BP 130/58 | HR 61 | Ht 60.0 in | Wt 152.0 lb

## 2022-01-01 DIAGNOSIS — R072 Precordial pain: Secondary | ICD-10-CM

## 2022-01-01 DIAGNOSIS — N182 Chronic kidney disease, stage 2 (mild): Secondary | ICD-10-CM | POA: Diagnosis not present

## 2022-01-01 DIAGNOSIS — I10 Essential (primary) hypertension: Secondary | ICD-10-CM

## 2022-01-01 DIAGNOSIS — E7800 Pure hypercholesterolemia, unspecified: Secondary | ICD-10-CM | POA: Diagnosis not present

## 2022-01-01 DIAGNOSIS — I2510 Atherosclerotic heart disease of native coronary artery without angina pectoris: Secondary | ICD-10-CM

## 2022-01-01 DIAGNOSIS — D696 Thrombocytopenia, unspecified: Secondary | ICD-10-CM

## 2022-01-01 NOTE — Patient Instructions (Signed)
Medication Instructions:  Your physician recommends that you continue on your current medications as directed. Please refer to the Current Medication list given to you today.  *If you need a refill on your cardiac medications before your next appointment, please call your pharmacy*   Lab Work: None ordered this visit. Please have your kidney function checked with your primary care physician.  If you have labs (blood work) drawn today and your tests are completely normal, you will receive your results only by: [place] (if you have MyChart) OR A paper copy in the mail If you have any lab test that is abnormal or we need to change your treatment, we will call you to review the results.   Testing/Procedures: NONE   Follow-Up: At [facility], you and your health needs are our priority.  As part of our continuing mission to provide you with exceptional heart care, we have created designated Provider Care Teams.  These Care Teams include your primary Cardiologist (physician) and Advanced Practice Providers (APPs -  Physician Assistants and Nurse Practitioners) who all work together to provide you with the care you need, when you need it.  We recommend signing up for the patient portal called "MyChart".  Sign up information is provided on this After Visit Summary.  MyChart is used to connect with patients for Virtual Visits (Telemedicine).  Patients are able to view lab/test results, encounter notes, upcoming appointments, etc.  Non-urgent messages can be sent to your provider as well.   To learn more about what you can do with MyChart, go to [email or web address].    Your next appointment:   6 month(s)  The format for your next appointment:   In Person  Provider:   Kirk Ruths, MD

## 2022-01-14 ENCOUNTER — Telehealth (HOSPITAL_COMMUNITY): Payer: Self-pay

## 2022-01-14 NOTE — Telephone Encounter (Signed)
Called patient to see if she is interested in the Cardiac Rehab Program. Patient expressed interest. Explained scheduling process and went over insurance, patient verbalized understanding.  

## 2022-01-14 NOTE — Telephone Encounter (Signed)
Pt insurance is active and benefits verified through Medicare A/B. Co-pay $0.00, DED $226.00/$226.00 met, out of pocket $0.00/$0.00 met, co-insurance 20%. No pre-authorization required. Passport, 01/14/22 @ 10:51AM, REF#[id number]   How many CR sessions are covered? (36 sessions for TCR, 72 sessions for ICR)72 Is this a lifetime maximum or an annual maximum? No Has the member used any of these services to date? No Is there a time limit (weeks/months) on start of program and/or program completion? No     Will contact patient to see if she is interested in the Cardiac Rehab Program.

## 2022-02-02 IMAGING — CT CT HEART MORP W/ CTA COR W/ SCORE W/ CA W/CM &/OR W/O CM
4 of 7 series · 8 of 20 positions shown, 9 images · non-contrast
Comparison: None.
COMPARISON: None.
COMPARISON: None.

Addendum:
EXAM:
OVER-READ INTERPRETATION  CT CHEST

The following report is an over-read performed by radiologist Dr.
Kiri Jim [REDACTED] on 03/27/2021. This
over-read does not include interpretation of cardiac or coronary
anatomy or pathology. The coronary calcium score/coronary CTA
interpretation by the cardiologist is attached.
CLINICAL DATA: Chest pain
Cardiac CTA
MEDICATIONS:
Sub lingual nitro. 4 mg and lopressor 100mg
TECHNIQUE: The patient was scanned on a Siemens Force 192 scanner. Gantry
rotation speed was 250 msecs. Collimation was. 6 mm . A 120 kV
prospective scan was triggered in the ascending thoracic aorta at
140 HU's with full mA between 30-70% of the R-R interval . Average
HR during the scan was 59 bpm. The 3D data set was interpreted on a
dedicated work station using MPR, MIP and VRT modes. A total of 80
cc of contrast was used.

[Series 6: ts diast · axial · 0.39mm/px · z∈[-460,-419]mm · 2 of 308 slices shown, 3 images]
[im 103/308  vessel]
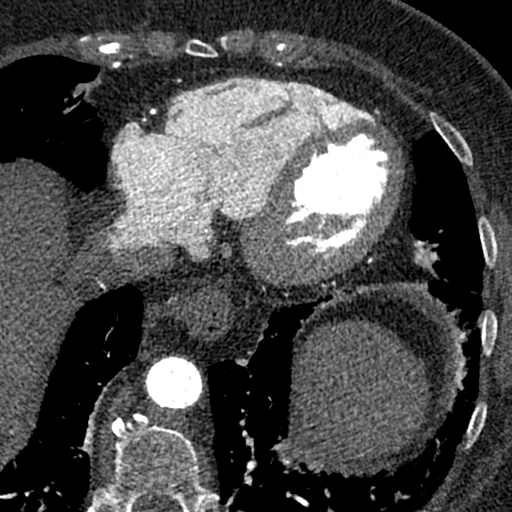
[im 103/308  lung]
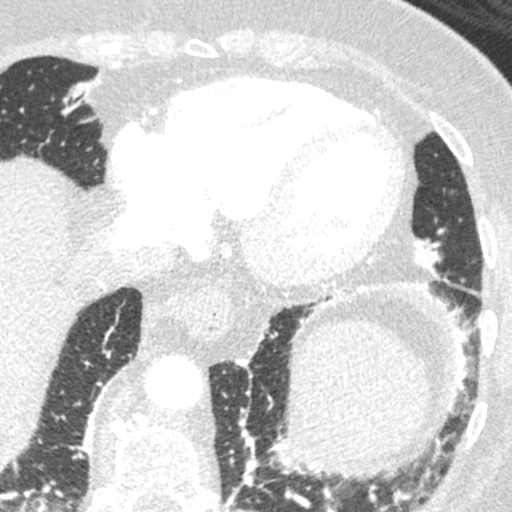
[im 205/308  vessel]
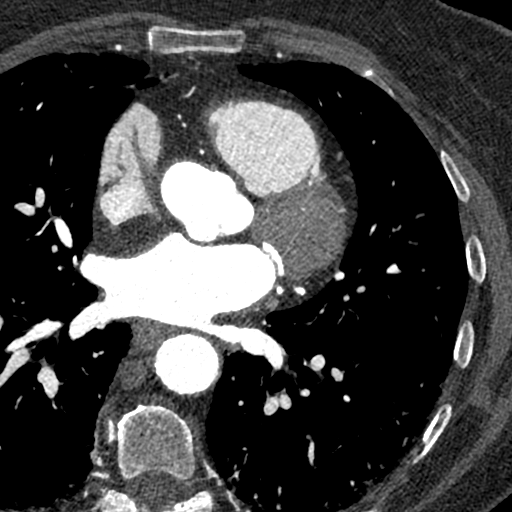

[Series 7: ts syst · axial · 0.39mm/px · z∈[-460,-419]mm · 2 of 308 slices shown]
[im 103/308  vessel]
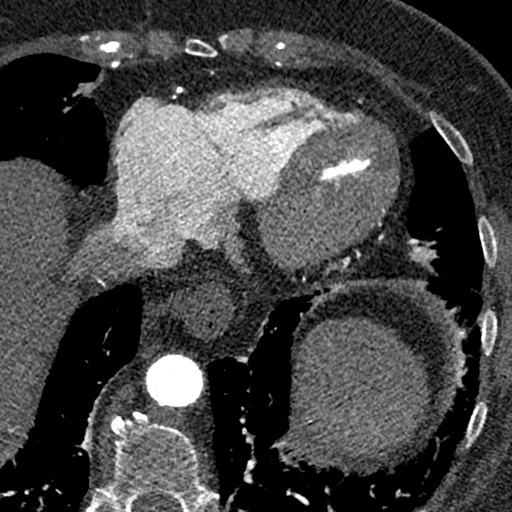
[im 205/308  vessel]
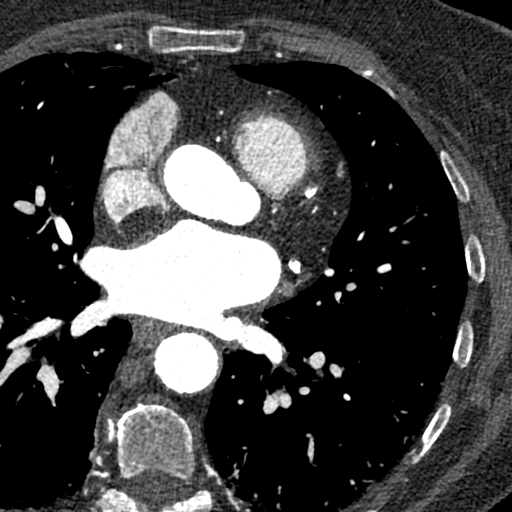

[Series 8: best syst · axial · 0.39mm/px · z∈[-460,-419]mm · 2 of 308 slices shown]
[im 103/308  vessel]
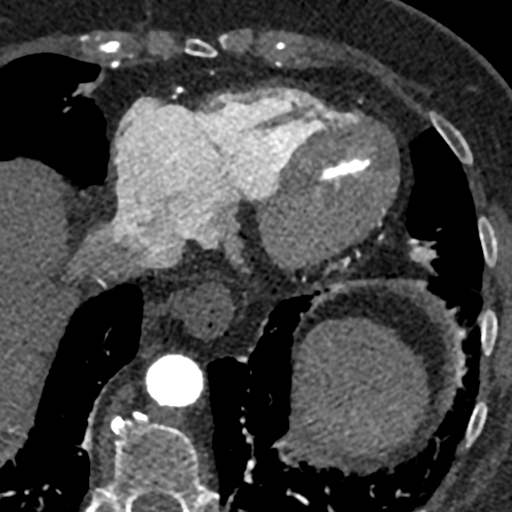
[im 205/308  vessel]
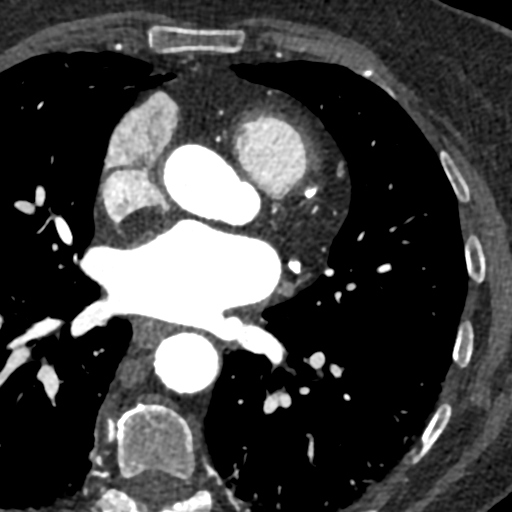

[Series 9: best diast · axial · 0.39mm/px · z∈[-460,-419]mm · 2 of 306 slices shown]
[im 102/306  vessel]
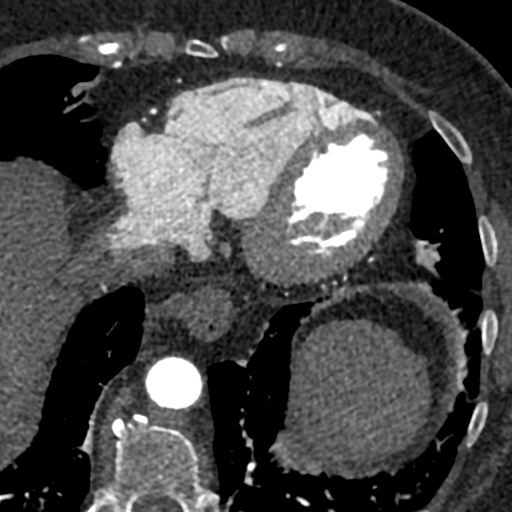
[im 204/306  vessel]
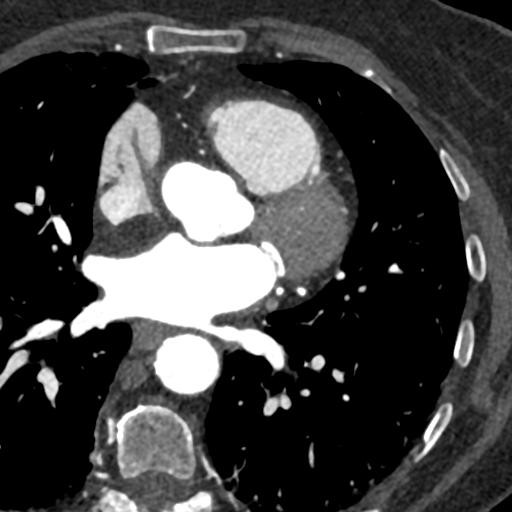

[8 of 20 positions shown; findings below may reference images not displayed]

FINDINGS: Atherosclerotic calcifications in the thoracic aorta. Within the
visualized portions of the thorax there are no suspicious appearing
pulmonary nodules or masses, there is no acute consolidative
airspace disease, no pleural effusions, no pneumothorax and no
lymphadenopathy. Visualized portions of the upper abdomen are
unremarkable. There are no aggressive appearing lytic or blastic
lesions noted in the visualized portions of the skeleton.
IMPRESSION: 1.  Aortic Atherosclerosis (5TT15-4JU.U).

ADDENDUM:
Upon further review in the central aspect of the right lower lobe
(axial image 30 of series 12) there is a 13 x 9 mm (mean diameter of
10.5 mm) smoothly marginated pulmonary nodule which appears to have
some internal macroscopic lipid. This is very similar to remote
prior study from 08/05/2007 which measured 1.0 x 0.8 cm on axial
image 35 of series 3 when measured in a similar fashion on the prior
examination, presumably a benign lesion such as a hamartoma.
FINDINGS: Non-cardiac: See separate report from [REDACTED]. No
significant findings on limited lung and soft tissue windows.

Calcium score: Severe 3 vessel coronary calcium

Coronary Arteries: Right dominant with no anomalies

LM: Short segment 25-49% ostial calcium

LAD: Heavily calcified to ostium Occluded mid vessel with likely
retrograde filling of the distal LAD

IM: Small diffusely diseased and calcified

Diagonal branches not well seen

Circumflex: Long area of 50-69% calcified plaque proximally 25-49%
calcified plaque in mid/distal vessel and AV groove

OM1: 25-49% calcified plaque proximally

RCA: Heavily calcified 50-69% calcified plaque proximally 70-99%
calcified plaque in mid vessel 25-49% calcified plaque distally at
crux of PDA take off
IMPRESSION: 1. 3 Vessel coronary calcium score 4701 which is 98 th percentile
for age / sex

2. CAD RADS 5 Occluded mid LAD with distal vessel filling by
collaterals Likely high grade mid RCA disease as well

3. Study sent for FFR if they can model but would proceed with heart
catheterization

4.  Normal ascending thoracic aorta 2.8 cm

5.  PFO present

Massaru Hoss

*** End of Addendum ***
Addendum:
EXAM:
OVER-READ INTERPRETATION  CT CHEST

The following report is an over-read performed by radiologist Dr.
Kiri Jim [REDACTED] on 03/27/2021. This
over-read does not include interpretation of cardiac or coronary
anatomy or pathology. The coronary calcium score/coronary CTA
interpretation by the cardiologist is attached.
FINDINGS: Atherosclerotic calcifications in the thoracic aorta. Within the
visualized portions of the thorax there are no suspicious appearing
pulmonary nodules or masses, there is no acute consolidative
airspace disease, no pleural effusions, no pneumothorax and no
lymphadenopathy. Visualized portions of the upper abdomen are
unremarkable. There are no aggressive appearing lytic or blastic
lesions noted in the visualized portions of the skeleton.
IMPRESSION: 1.  Aortic Atherosclerosis (5TT15-4JU.U).

ADDENDUM:
Upon further review in the central aspect of the right lower lobe
(axial image 30 of series 12) there is a 13 x 9 mm (mean diameter of
10.5 mm) smoothly marginated pulmonary nodule which appears to have
some internal macroscopic lipid. This is very similar to remote
prior study from 08/05/2007 which measured 1.0 x 0.8 cm on axial
image 35 of series 3 when measured in a similar fashion on the prior
examination, presumably a benign lesion such as a hamartoma.

*** End of Addendum ***
EXAM:
OVER-READ INTERPRETATION  CT CHEST

The following report is an over-read performed by radiologist Dr.
Kiri Jim [REDACTED] on 03/27/2021. This
over-read does not include interpretation of cardiac or coronary
anatomy or pathology. The coronary calcium score/coronary CTA
interpretation by the cardiologist is attached.
FINDINGS: Atherosclerotic calcifications in the thoracic aorta. Within the
visualized portions of the thorax there are no suspicious appearing
pulmonary nodules or masses, there is no acute consolidative
airspace disease, no pleural effusions, no pneumothorax and no
lymphadenopathy. Visualized portions of the upper abdomen are
unremarkable. There are no aggressive appearing lytic or blastic
lesions noted in the visualized portions of the skeleton.
IMPRESSION: 1.  Aortic Atherosclerosis (5TT15-4JU.U).

## 2022-02-18 DIAGNOSIS — H903 Sensorineural hearing loss, bilateral: Secondary | ICD-10-CM | POA: Diagnosis not present

## 2022-02-18 DIAGNOSIS — L299 Pruritus, unspecified: Secondary | ICD-10-CM | POA: Diagnosis not present

## 2022-03-03 DIAGNOSIS — E119 Type 2 diabetes mellitus without complications: Secondary | ICD-10-CM | POA: Diagnosis not present

## 2022-03-23 ENCOUNTER — Telehealth: Payer: Self-pay | Admitting: Cardiology

## 2022-03-23 ENCOUNTER — Telehealth (HOSPITAL_COMMUNITY): Payer: Self-pay

## 2022-03-23 DIAGNOSIS — I2510 Atherosclerotic heart disease of native coronary artery without angina pectoris: Secondary | ICD-10-CM

## 2022-03-23 MED ORDER — ROSUVASTATIN CALCIUM 40 MG PO TABS: 40.0000 mg | ORAL_TABLET | Freq: Every day | ORAL | 3 refills | Status: DC

## 2022-03-23 NOTE — Telephone Encounter (Signed)
Attempted to call patient in regards to Cardiac Rehab - LM on VM 

## 2022-03-23 NOTE — Telephone Encounter (Signed)
*  STAT* If patient is at the pharmacy, call can be transferred to refill team.   1. Which medications need to be refilled? (please list name of each medication and dose if known) rosuvastatin (CRESTOR) 40 MG tablet    2. Which pharmacy/location (including street and city if local pharmacy) is medication to be sent to?  [facility] [place]    3. Do they need a 30 day or 90 day supply? [place]

## 2022-03-24 ENCOUNTER — Encounter (HOSPITAL_COMMUNITY)
Admission: RE | Admit: 2022-03-24 | Discharge: 2022-03-24 | Disposition: A | Discharge status: Home / Self Care | Payer: Medicare Other | Source: Ambulatory Visit | Attending: Cardiology | Admitting: Cardiology

## 2022-03-24 ENCOUNTER — Encounter (HOSPITAL_COMMUNITY): Payer: Self-pay

## 2022-03-24 VITALS — BP 108/72 | HR 56 | Ht 59.5 in | Wt 151.5 lb

## 2022-03-24 DIAGNOSIS — Z5189 Encounter for other specified aftercare: Secondary | ICD-10-CM | POA: Insufficient documentation

## 2022-03-24 DIAGNOSIS — I2089 Other forms of angina pectoris: Secondary | ICD-10-CM | POA: Insufficient documentation

## 2022-03-24 LAB — GLUCOSE, CAPILLARY: Glucose-Capillary: 195 mg/dL — ABNORMAL HIGH (ref 70–99)

## 2022-03-24 NOTE — Progress Notes (Signed)
Lou complained of feeling a little lightheaded  2 minutes into the 6 minute walk test at cardiac rehab orientation. [place] Cerise did stop due to fatigue from between 3 minutes 8 second and restarted the 6 minute walk test  at 4 minutes 11 seconds. Orthostatic BP's checked. 112/64 sitting. Standing BP 114/64. Symptoms resolved with rest. CBG 195. Will forward today's readings to [facility]. Max heart rate 86. Resting heart rate in mid 50's to low 60's. No symptoms or complaints upon exit from cardiac rehab orientation.Harrell Gave RN BSN

## 2022-03-24 NOTE — Progress Notes (Signed)
Cardiac Individual Treatment Plan  Patient Details  Name: Victoria Ferguson MRN: [id number] Date of Birth: [birth year] Referring Provider:   Flowsheet Row INTENSIVE CARDIAC REHAB ORIENT from 03/24/2022 in [facility]  Referring Provider Lelon Perla, MD       Initial Encounter Date:  [place] from 03/24/2022 in [facility]  Date 03/24/22       Visit Diagnosis: Chronic stable angina  Patient's Home Medications on Admission:  Current Outpatient Medications:    aspirin EC 81 MG tablet, Take 1 tablet (81 mg total) by mouth daily. Swallow whole. (Patient taking differently: Take 81 mg by mouth at bedtime. Swallow whole.), Disp: 90 tablet, Rfl: 3   ergocalciferol (VITAMIN D2) 1.25 MG (50000 UT) capsule, Take 50,000 Units by mouth every Monday. WITH LUNCH, Disp: , Rfl:    ezetimibe (ZETIA) 10 MG tablet, Take 1 tablet (10 mg total) by mouth daily. (Patient taking differently: Take 10 mg by mouth daily with lunch.), Disp: 90 tablet, Rfl: 3   ferrous sulfate 325 (65 FE) MG tablet, Take 325 mg by mouth every 3 (three) days., Disp: , Rfl:    FREESTYLE LITE test strip, , Disp: , Rfl:    glipiZIDE (GLUCOTROL) 5 MG tablet, Take 5 mg by mouth daily with lunch., Disp: , Rfl:    hydrochlorothiazide (HYDRODIURIL) 25 MG tablet, Take 25 mg by mouth daily with lunch., Disp: , Rfl:    levothyroxine (SYNTHROID) 75 MCG tablet, Take 75 mcg by mouth daily., Disp: , Rfl:    losartan (COZAAR) 50 MG tablet, Take 50 mg by mouth daily with lunch., Disp: , Rfl:    metoprolol succinate (TOPROL XL) 25 MG 24 hr tablet, Take 1 tablet (25 mg total) by mouth daily. (Patient taking differently: Take 25 mg by mouth at bedtime.), Disp: 90 tablet, Rfl: 3   nitroGLYCERIN (NITROSTAT) 0.4 MG SL tablet, Place 1 tablet (0.4 mg total) under the tongue every 5 (five) minutes as  needed for chest pain., Disp: 25 tablet, Rfl: 3   potassium chloride (KLOR-CON M) 10 MEQ tablet, Take 10 mEq by mouth daily with lunch., Disp: , Rfl:    rosuvastatin (CRESTOR) 40 MG tablet, Take 1 tablet (40 mg total) by mouth daily., Disp: 90 tablet, Rfl: 3  Past Medical History: Past Medical History:  Diagnosis Date   Diabetes mellitus    Hyperlipemia    Hypertension    Hypothyroidism    Lung nodule     Tobacco Use: Social History   Tobacco Use  Smoking Status Former   Types: Cigarettes  Smokeless Tobacco Not on file    Labs: Review Flowsheet       Latest Ref Rng & Units 07/07/2021 09/24/2021  Labs for ITP Cardiac and Pulmonary Rehab  Cholestrol 100 - 199 mg/dL 159  116   LDL (calc) 0 - 99 mg/dL 72  41   Direct LDL 0 - 99 mg/dL - 39   HDL-C >39 mg/dL 60  49   Trlycerides 0 - 149 mg/dL 157  156     Capillary Blood Glucose: Lab Results  Component Value Date   GLUCAP 195 (H) 03/24/2022   GLUCAP 145 (H) 12/19/2021   GLUCAP 188 (H) 12/19/2021     Exercise Target Goals: Exercise Program Goal: Individual exercise prescription set using results from initial 6 min walk test and THRR while considering  patient's activity barriers and safety.   Exercise Prescription Goal: Initial exercise prescription builds to 30-45 minutes a day of aerobic activity, 2-3 days per week.  Home exercise guidelines will be given to patient during program as part of exercise prescription that the participant will acknowledge.  Activity Barriers & Risk Stratification:  Activity Barriers & Cardiac Risk Stratification - 03/24/22 1209       Activity Barriers & Cardiac Risk Stratification   Activity Barriers Other (comment)    Comments Chronic knee pain, occassional bilateral ankle edema, and bilateral leg pain in the mornings.    Cardiac Risk Stratification High             6 Minute Walk:  6 Minute Walk     Row Name 03/24/22 1154         6 Minute Walk   Phase Initial      Distance 863 feet     Walk Time 6 minutes     # of Rest Breaks 1  Seated rest break at 3 mins 8 secs, restart at 4 mins 11 secs.     MPH 1.63     METS 1.23     RPE 11     Perceived Dyspnea  0     VO2 Peak 4.31     Symptoms Yes (comment)     Comments Patient c/o feeling lightheaded, which she thinks was related to wearing her reading glasses, resolved with rest and removal of readers. Seated rest break taken due to fatigue.     Resting HR 56 bpm     Resting BP 108/72     Resting Oxygen Saturation  97 %     Exercise Oxygen Saturation  during 6 min walk 99 %     Max Ex. HR 86 bpm     Max Ex. BP 138/60     2 Minute Post BP 112/64              Oxygen Initial Assessment:   Oxygen Re-Evaluation:   Oxygen Discharge (Final Oxygen Re-Evaluation):   Initial Exercise Prescription:  Initial Exercise Prescription - 03/24/22 1300       Date of Initial Exercise RX and Referring Provider   Date 03/24/22    Referring Provider Lelon Perla, MD    Expected Discharge Date 05/22/22      NuStep   Level 1    SPM 75    Minutes 25    METs 1.5      Prescription Details   Frequency (times per week) 2    Duration Progress to 30 minutes of continuous aerobic without signs/symptoms of physical distress      Intensity   THRR 40-80% of Max Heartrate 62-125    Ratings of Perceived Exertion 11-13    Perceived Dyspnea 0-4      Progression   Progression Continue to progress workloads to maintain intensity without signs/symptoms of physical distress.      Resistance Training   Training Prescription Yes    Weight 1 lb    Reps 10-15             Perform Capillary Blood Glucose checks as needed.  Exercise Prescription Changes:   Exercise Comments:   Exercise Goals and Review:   Exercise Goals     Row Name 03/24/22 1209             Exercise Goals   Increase Physical Activity Yes       Intervention Provide advice,  education, support and counseling about physical  activity/exercise needs.;Develop an individualized exercise prescription for aerobic and resistive training based on initial evaluation findings, risk stratification, comorbidities and participant's personal goals.       Expected Outcomes Short Term: Attend rehab on a regular basis to increase amount of physical activity.;Long Term: Add in home exercise to make exercise part of routine and to increase amount of physical activity.;Long Term: Exercising regularly at least 3-5 days a week.       Increase Strength and Stamina Yes       Intervention Provide advice, education, support and counseling about physical activity/exercise needs.;Develop an individualized exercise prescription for aerobic and resistive training based on initial evaluation findings, risk stratification, comorbidities and participant's personal goals.       Expected Outcomes Short Term: Increase workloads from initial exercise prescription for resistance, speed, and METs.;Short Term: Perform resistance training exercises routinely during rehab and add in resistance training at home;Long Term: Improve cardiorespiratory fitness, muscular endurance and strength as measured by increased METs and functional capacity (6MWT)       Able to understand and use rate of perceived exertion (RPE) scale Yes       Intervention Provide education and explanation on how to use RPE scale       Expected Outcomes Short Term: Able to use RPE daily in rehab to express subjective intensity level;Long Term:  Able to use RPE to guide intensity level when exercising independently       Knowledge and understanding of Target Heart Rate Range (THRR) Yes       Intervention Provide education and explanation of THRR including how the numbers were predicted and where they are located for reference       Expected Outcomes Short Term: Able to state/look up THRR;Long Term: Able to use THRR to govern intensity when exercising independently;Short Term: Able to use daily as  guideline for intensity in rehab       Able to check pulse independently Yes       Intervention Provide education and demonstration on how to check pulse in carotid and radial arteries.;Review the importance of being able to check your own pulse for safety during independent exercise       Expected Outcomes Long Term: Able to check pulse independently and accurately;Short Term: Able to explain why pulse checking is important during independent exercise       Understanding of Exercise Prescription Yes       Intervention Provide education, explanation, and written materials on patient's individual exercise prescription       Expected Outcomes Short Term: Able to explain program exercise prescription;Long Term: Able to explain home exercise prescription to exercise independently                Exercise Goals Re-Evaluation :   Discharge Exercise Prescription (Final Exercise Prescription Changes):   Nutrition:  Target Goals: Understanding of nutrition guidelines, daily intake of sodium '1500mg'$ , cholesterol '200mg'$ , calories 30% from fat and 7% or less from saturated fats, daily to have 5 or more servings of fruits and vegetables.  Biometrics:  Pre Biometrics - 03/24/22 1058       Pre Biometrics   Waist Circumference 36.75 inches    Hip Circumference 44.25 inches    Waist to Hip Ratio 0.83 %    Triceps Skinfold 34 mm    % Body Fat 43.7 %    Grip Strength 12 kg    Flexibility 11 in    Single  Leg Stand 7.18 seconds              Nutrition Therapy Plan and Nutrition Goals:   Nutrition Assessments:  MEDIFICTS Score Key: ?70 Need to make dietary changes  40-70 Heart Healthy Diet ? 40 Therapeutic Level Cholesterol Diet    Picture Your Plate Scores: <67 Unhealthy dietary pattern with much room for improvement. 41-50 Dietary pattern unlikely to meet recommendations for good health and room for improvement. 51-60 More healthful dietary pattern, with some room for improvement.   >60 Healthy dietary pattern, although there may be some specific behaviors that could be improved.    Nutrition Goals Re-Evaluation:   Nutrition Goals Re-Evaluation:   Nutrition Goals Discharge (Final Nutrition Goals Re-Evaluation):   Psychosocial: Target Goals: Acknowledge presence or absence of significant depression and/or stress, maximize coping skills, provide positive support system. Participant is able to verbalize types and ability to use techniques and skills needed for reducing stress and depression.  Initial Review & Psychosocial Screening:  Initial Psych Review & Screening - 03/24/22 1218       Initial Review   Current issues with Current Stress Concerns;History of Depression    Source of Stress Concerns Family;Unable to participate in former interests or hobbies;Unable to perform yard/household activities    Comments Victoria Ferguson denies being depressed currenlty but says she has felt depressed at times as her husband has poor mobility one of Lous son's has dementia.      Family Dynamics   Good Support System? Yes   Lous has her husband and children for support     Barriers   Psychosocial barriers to participate in program The patient should benefit from training in stress management and relaxation.      Screening Interventions   Interventions Encouraged to exercise;To provide support and resources with identified psychosocial needs    Expected Outcomes Long Term Goal: Stressors or current issues are controlled or eliminated.             Quality of Life Scores:  Quality of Life - 03/24/22 1254       Quality of Life   Select Quality of Life      Quality of Life Scores   Health/Function Pre 25.2 %    Socioeconomic Pre 24.43 %    Psych/Spiritual Pre 20.57 %    Family Pre 24 %    GLOBAL Pre 23.91 %            Scores of 19 and below usually indicate a poorer quality of life in these areas.  A difference of  2-3 points is a clinically meaningful difference.   A difference of 2-3 points in the total score of the Quality of Life Index has been associated with significant improvement in overall quality of life, self-image, physical symptoms, and general health in studies assessing change in quality of life.  PHQ-9: Review Flowsheet       03/24/2022  Depression screen PHQ 2/9  Decreased Interest 0  Down, Depressed, Hopeless 0  PHQ - 2 Score 0   Interpretation of Total Score  Total Score Depression Severity:  1-4 = Minimal depression, 5-9 = Mild depression, 10-14 = Moderate depression, 15-19 = Moderately severe depression, 20-27 = Severe depression   Psychosocial Evaluation and Intervention:   Psychosocial Re-Evaluation:   Psychosocial Discharge (Final Psychosocial Re-Evaluation):   Vocational Rehabilitation: Provide vocational rehab assistance to qualifying candidates.   Vocational Rehab Evaluation & Intervention:  Vocational Rehab - 03/24/22 8938  Initial Vocational Rehab Evaluation & Intervention   Assessment shows need for Vocational Rehabilitation No   Victoria Ferguson is reitred and does not need vocational rehab at this time            Education: Education Goals: Education classes will be provided on a weekly basis, covering required topics. Participant will state understanding/return demonstration of topics presented.     Core Videos: Exercise    Move It!  Clinical staff conducted group or individual video education with verbal and written material and guidebook.  Patient learns the recommended Pritikin exercise program. Exercise with the goal of living a long, healthy life. Some of the health benefits of exercise include controlled diabetes, healthier blood pressure levels, improved cholesterol levels, improved heart and lung capacity, improved sleep, and better body composition. Everyone should speak with their doctor before starting or changing an exercise routine.  Biomechanical Limitations Clinical staff conducted  group or individual video education with verbal and written material and guidebook.  Patient learns how biomechanical limitations can impact exercise and how we can mitigate and possibly overcome limitations to have an impactful and balanced exercise routine.  Body Composition Clinical staff conducted group or individual video education with verbal and written material and guidebook.  Patient learns that body composition (ratio of muscle mass to fat mass) is a key component to assessing overall fitness, rather than body weight alone. Increased fat mass, especially visceral belly fat, can put [place] at increased risk for metabolic syndrome, type 2 diabetes, heart disease, and even death. It is recommended to combine diet and exercise (cardiovascular and resistance training) to improve your body composition. Seek guidance from your physician and exercise physiologist before implementing an exercise routine.  Exercise Action Plan Clinical staff conducted group or individual video education with verbal and written material and guidebook.  Patient learns the recommended strategies to achieve and enjoy long-term exercise adherence, including variety, self-motivation, self-efficacy, and positive decision making. Benefits of exercise include fitness, good health, weight management, more energy, better sleep, less stress, and overall well-being.  Medical   Heart Disease Risk Reduction Clinical staff conducted group or individual video education with verbal and written material and guidebook.  Patient learns our heart is our most vital organ as it circulates oxygen, nutrients, white blood cells, and hormones throughout the entire body, and carries waste away. Data supports a plant-based eating plan like the Pritikin Program for its effectiveness in slowing progression of and reversing heart disease. The video provides a number of recommendations to address heart disease.   Metabolic Syndrome and Belly Fat   Clinical staff conducted group or individual video education with verbal and written material and guidebook.  Patient learns what metabolic syndrome is, how it leads to heart disease, and how one can reverse it and keep it from coming back. You have metabolic syndrome if you have 3 of the following 5 criteria: abdominal obesity, high blood pressure, high triglycerides, low HDL cholesterol, and high blood sugar.  Hypertension and Heart Disease Clinical staff conducted group or individual video education with verbal and written material and guidebook.  Patient learns that high blood pressure, or hypertension, is very common in the [place]. Hypertension is largely due to excessive salt intake, but other important risk factors include being overweight, physical inactivity, drinking too much alcohol, smoking, and not eating enough potassium from fruits and vegetables. High blood pressure is a leading risk factor for heart attack, stroke, congestive heart failure, dementia, kidney failure, and premature death. Long-term  effects of excessive salt intake include stiffening of the arteries and thickening of heart muscle and organ damage. Recommendations include ways to reduce hypertension and the risk of heart disease.  Diseases of Our Time - Focusing on Diabetes Clinical staff conducted group or individual video education with verbal and written material and guidebook.  Patient learns why the best way to stop diseases of our time is prevention, through food and other lifestyle changes. Medicine (such as prescription pills and surgeries) is often only a Band-Aid on the problem, not a long-term solution. Most common diseases of our time include obesity, type 2 diabetes, hypertension, heart disease, and cancer. The Pritikin Program is recommended and has been proven to help reduce, reverse, and/or prevent the damaging effects of metabolic syndrome.  Nutrition   Overview of the Pritikin Eating Plan   Clinical staff conducted group or individual video education with verbal and written material and guidebook.  Patient learns about the [place] for disease risk reduction. The [place] emphasizes a wide variety of unrefined, minimally-processed carbohydrates, like fruits, vegetables, whole grains, and legumes. Go, Caution, and Stop food choices are explained. Plant-based and lean animal proteins are emphasized. Rationale provided for low sodium intake for blood pressure control, low added sugars for blood sugar stabilization, and low added fats and oils for coronary artery disease risk reduction and weight management.  Calorie Density  Clinical staff conducted group or individual video education with verbal and written material and guidebook.  Patient learns about calorie density and how it impacts the Pritikin Eating Plan. Knowing the characteristics of the food you choose will help you decide whether those foods will lead to weight gain or weight loss, and whether you want to consume more or less of them. Weight loss is usually a side effect of the Pritikin Eating Plan because of its focus on low calorie-dense foods.  Label Reading  Clinical staff conducted group or individual video education with verbal and written material and guidebook.  Patient learns about the Pritikin recommended label reading guidelines and corresponding recommendations regarding calorie density, added sugars, sodium content, and whole grains.  Dining Out - Part 1  Clinical staff conducted group or individual video education with verbal and written material and guidebook.  Patient learns that restaurant meals can be sabotaging because they can be so high in calories, fat, sodium, and/or sugar. Patient learns recommended strategies on how to positively address this and avoid unhealthy pitfalls.  Facts on Fats  Clinical staff conducted group or individual video education with verbal and written  material and guidebook.  Patient learns that lifestyle modifications can be just as effective, if not more so, as many medications for lowering your risk of heart disease. A Pritikin lifestyle can help to reduce your risk of inflammation and atherosclerosis (cholesterol build-up, or plaque, in the artery walls). Lifestyle interventions such as dietary choices and physical activity address the cause of atherosclerosis. A review of the types of fats and their impact on blood cholesterol levels, along with dietary recommendations to reduce fat intake is also included.  Nutrition Action Plan  Clinical staff conducted group or individual video education with verbal and written material and guidebook.  Patient learns how to incorporate Pritikin recommendations into their lifestyle. Recommendations include planning and keeping personal health goals in mind as an important part of their success.  Healthy Mind-Set    Healthy Minds, Bodies, Hearts  Clinical staff conducted group or individual video education with verbal and written material  and guidebook.  Patient learns how to identify when they are stressed. Video will discuss the impact of that stress, as well as the many benefits of stress management. Patient will also be introduced to stress management techniques. The way we think, act, and feel has an impact on our hearts.  How Our Thoughts Can Heal Our Hearts  Clinical staff conducted group or individual video education with verbal and written material and guidebook.  Patient learns that negative thoughts can cause depression and anxiety. This can result in negative lifestyle behavior and serious health problems. Cognitive behavioral therapy is an effective method to help control our thoughts in order to change and improve our emotional outlook.  Additional Videos:  Exercise    Improving Performance  Clinical staff conducted group or individual video education with verbal and written material and  guidebook.  Patient learns to use a non-linear approach by alternating intensity levels and lengths of time spent exercising to help burn more calories and lose more body fat. Cardiovascular exercise helps improve heart health, metabolism, hormonal balance, blood sugar control, and recovery from fatigue. Resistance training improves strength, endurance, balance, coordination, reaction time, metabolism, and muscle mass. Flexibility exercise improves circulation, posture, and balance. Seek guidance from your physician and exercise physiologist before implementing an exercise routine and learn your capabilities and proper form for all exercise.  Introduction to Yoga  Clinical staff conducted group or individual video education with verbal and written material and guidebook.  Patient learns about yoga, a discipline of the coming together of mind, breath, and body. The benefits of yoga include improved flexibility, improved range of motion, better posture and core strength, increased lung function, weight loss, and positive self-image. Yoga's heart health benefits include lowered blood pressure, healthier heart rate, decreased cholesterol and triglyceride levels, improved immune function, and reduced stress. Seek guidance from your physician and exercise physiologist before implementing an exercise routine and learn your capabilities and proper form for all exercise.  Medical   Aging: Enhancing Your Quality of Life  Clinical staff conducted group or individual video education with verbal and written material and guidebook.  Patient learns key strategies and recommendations to stay in good physical health and enhance quality of life, such as prevention strategies, having an advocate, securing a [place], and keeping a list of medications and system for tracking them. It also discusses how to avoid risk for bone loss.  Biology of Weight Control  Clinical staff conducted group or  individual video education with verbal and written material and guidebook.  Patient learns that weight gain occurs because we consume more calories than we burn (eating more, moving less). Even if your body weight is normal, you may have higher ratios of fat compared to muscle mass. Too much body fat puts you at increased risk for cardiovascular disease, heart attack, stroke, type 2 diabetes, and obesity-related cancers. In addition to exercise, following the [place] can help reduce your risk.  Decoding Lab Results  Clinical staff conducted group or individual video education with verbal and written material and guidebook.  Patient learns that lab test reflects one measurement whose values change over time and are influenced by many factors, including medication, stress, sleep, exercise, food, hydration, pre-existing medical conditions, and more. It is recommended to use the knowledge from this video to become more involved with your lab results and evaluate your numbers to speak with your doctor.   Diseases of Our Time - Overview  Clinical staff conducted group or individual video education with verbal and written material and guidebook.  Patient learns that according to the CDC, 50% to 70% of chronic diseases (such as obesity, type 2 diabetes, elevated lipids, hypertension, and heart disease) are avoidable through lifestyle improvements including healthier food choices, listening to satiety cues, and increased physical activity.  Sleep Disorders Clinical staff conducted group or individual video education with verbal and written material and guidebook.  Patient learns how good quality and duration of sleep are important to overall health and well-being. Patient also learns about sleep disorders and how they impact health along with recommendations to address them, including discussing with a physician.  Nutrition  Dining Out - Part 2 Clinical staff conducted group or individual video  education with verbal and written material and guidebook.  Patient learns how to plan ahead and communicate in order to maximize their dining experience in a healthy and nutritious manner. Included are recommended food choices based on the type of restaurant the patient is visiting.   Fueling a Best boy conducted group or individual video education with verbal and written material and guidebook.  There is a strong connection between our food choices and our health. Diseases like obesity and type 2 diabetes are very prevalent and are in large-part due to lifestyle choices. The Pritikin Eating Plan provides plenty of food and hunger-curbing satisfaction. It is easy to follow, affordable, and helps reduce health risks.  Menu Workshop  Clinical staff conducted group or individual video education with verbal and written material and guidebook.  Patient learns that restaurant meals can sabotage health goals because they are often packed with calories, fat, sodium, and sugar. Recommendations include strategies to plan ahead and to communicate with the manager, chef, or server to help order a healthier meal.  Planning Your Eating Strategy  Clinical staff conducted group or individual video education with verbal and written material and guidebook.  Patient learns about the [place] and its benefit of reducing the risk of disease. The [place] does not focus on calories. Instead, it emphasizes high-quality, nutrient-rich foods. By knowing the characteristics of the foods, we choose, we can determine their calorie density and make informed decisions.  Targeting Your Nutrition Priorities  Clinical staff conducted group or individual video education with verbal and written material and guidebook.  Patient learns that lifestyle habits have a tremendous impact on disease risk and progression. This video provides eating and physical activity recommendations based on your  personal health goals, such as reducing LDL cholesterol, losing weight, preventing or controlling type 2 diabetes, and reducing high blood pressure.  Vitamins and Minerals  Clinical staff conducted group or individual video education with verbal and written material and guidebook.  Patient learns different ways to obtain key vitamins and minerals, including through a recommended healthy diet. It is important to discuss all supplements you take with your doctor.   Healthy Mind-Set    Smoking Cessation  Clinical staff conducted group or individual video education with verbal and written material and guidebook.  Patient learns that cigarette smoking and tobacco addiction pose a serious health risk which affects millions of people. Stopping smoking will significantly reduce the risk of heart disease, lung disease, and many forms of cancer. Recommended strategies for quitting are covered, including working with your doctor to develop a successful plan.  Culinary   Becoming a Financial trader conducted group or individual video education with verbal and written  material and guidebook.  Patient learns that cooking at home can be healthy, cost-effective, quick, and puts them in control. Keys to cooking healthy recipes will include looking at your recipe, assessing your equipment needs, planning ahead, making it simple, choosing cost-effective seasonal ingredients, and limiting the use of added fats, salts, and sugars.  Cooking - Breakfast and Snacks  Clinical staff conducted group or individual video education with verbal and written material and guidebook.  Patient learns how important breakfast is to satiety and nutrition through the entire day. Recommendations include key foods to eat during breakfast to help stabilize blood sugar levels and to prevent overeating at meals later in the day. Planning ahead is also a key component.  Cooking - Human resources officer conducted  group or individual video education with verbal and written material and guidebook.  Patient learns eating strategies to improve overall health, including an approach to cook more at home. Recommendations include thinking of animal protein as a side on your plate rather than center stage and focusing instead on lower calorie dense options like vegetables, fruits, whole grains, and plant-based proteins, such as beans. Making sauces in large quantities to freeze for later and leaving the skin on your vegetables are also recommended to maximize your experience.  Cooking - Healthy Salads and Dressing Clinical staff conducted group or individual video education with verbal and written material and guidebook.  Patient learns that vegetables, fruits, whole grains, and legumes are the foundations of the [place]. Recommendations include how to incorporate each of these in flavorful and healthy salads, and how to create homemade salad dressings. Proper handling of ingredients is also covered. Cooking - Soups and [employer] - Soups and Desserts Clinical staff conducted group or individual video education with verbal and written material and guidebook.  Patient learns that Pritikin soups and desserts make for easy, nutritious, and delicious snacks and meal components that are low in sodium, fat, sugar, and calorie density, while high in vitamins, minerals, and filling fiber. Recommendations include simple and healthy ideas for soups and desserts.   Overview     The Pritikin Solution Program Overview Clinical staff conducted group or individual video education with verbal and written material and guidebook.  Patient learns that the results of the [place] Program have been documented in more than 100 articles published in peer-reviewed journals, and the benefits include reducing risk factors for (and, in some cases, even reversing) high cholesterol, high blood pressure, type 2 diabetes, obesity,  and more! An overview of the three key pillars of the Pritikin Program will be covered: eating well, doing regular exercise, and having a healthy mind-set.  WORKSHOPS  Exercise: Exercise Basics: Building Your Action Plan Clinical staff led group instruction and group discussion with PowerPoint presentation and patient guidebook. To enhance the learning environment the use of posters, models and videos may be added. At the conclusion of this workshop, patients will comprehend the difference between physical activity and exercise, as well as the benefits of incorporating both, into their routine. Patients will understand the FITT (Frequency, Intensity, Time, and Type) principle and how to use it to build an exercise action plan. In addition, safety concerns and other considerations for exercise and cardiac rehab will be addressed by the presenter. The purpose of this lesson is to promote a comprehensive and effective weekly exercise routine in order to improve patients' overall level of fitness.   Managing Heart Disease: Your Path to a Healthier Heart Clinical  staff led group instruction and group discussion with PowerPoint presentation and patient guidebook. To enhance the learning environment the use of posters, models and videos may be added.At the conclusion of this workshop, patients will understand the anatomy and physiology of the heart. Additionally, they will understand how Pritikin's three pillars impact the risk factors, the progression, and the management of heart disease.  The purpose of this lesson is to provide a high-level overview of the heart, heart disease, and how the Pritikin lifestyle positively impacts risk factors.  Exercise Biomechanics Clinical staff led group instruction and group discussion with PowerPoint presentation and patient guidebook. To enhance the learning environment the use of posters, models and videos may be added. Patients will learn how the structural  parts of their bodies function and how these functions impact their daily activities, movement, and exercise. Patients will learn how to promote a neutral spine, learn how to manage pain, and identify ways to improve their physical movement in order to promote healthy living. The purpose of this lesson is to expose patients to common physical limitations that impact physical activity. Participants will learn practical ways to adapt and manage aches and pains, and to minimize their effect on regular exercise. Patients will learn how to maintain good posture while sitting, walking, and lifting.  Balance Training and Fall Prevention  Clinical staff led group instruction and group discussion with PowerPoint presentation and patient guidebook. To enhance the learning environment the use of posters, models and videos may be added. At the conclusion of this workshop, patients will understand the importance of their sensorimotor skills (vision, proprioception, and the vestibular system) in maintaining their ability to balance as they age. Patients will apply a variety of balancing exercises that are appropriate for their current level of function. Patients will understand the common causes for poor balance, possible solutions to these problems, and ways to modify their physical environment in order to minimize their fall risk. The purpose of this lesson is to teach patients about the importance of maintaining balance as they age and ways to minimize their risk of falling.  WORKSHOPS   Nutrition:  Fueling a Scientist, research (physical sciences) led group instruction and group discussion with PowerPoint presentation and patient guidebook. To enhance the learning environment the use of posters, models and videos may be added. Patients will review the foundational principles of the [place] and understand what constitutes a serving size in each of the food groups. Patients will also learn Pritikin-friendly  foods that are better choices when away from home and review make-ahead meal and snack options. Calorie density will be reviewed and applied to three nutrition priorities: weight maintenance, weight loss, and weight gain. The purpose of this lesson is to reinforce (in a group setting) the key concepts around what patients are recommended to eat and how to apply these guidelines when away from home by planning and selecting Pritikin-friendly options. Patients will understand how calorie density may be adjusted for different weight management goals.  Mindful Eating  Clinical staff led group instruction and group discussion with PowerPoint presentation and patient guidebook. To enhance the learning environment the use of posters, models and videos may be added. Patients will briefly review the concepts of the [place] and the importance of low-calorie dense foods. The concept of mindful eating will be introduced as well as the importance of paying attention to internal hunger signals. Triggers for non-hunger eating and techniques for dealing with triggers will be explored. The purpose  of this lesson is to provide patients with the opportunity to review the basic principles of the [place], discuss the value of eating mindfully and how to measure internal cues of hunger and fullness using the Hunger Scale. Patients will also discuss reasons for non-hunger eating and learn strategies to use for controlling emotional eating.  Targeting Your Nutrition Priorities Clinical staff led group instruction and group discussion with PowerPoint presentation and patient guidebook. To enhance the learning environment the use of posters, models and videos may be added. Patients will learn how to determine their genetic susceptibility to disease by reviewing their family history. Patients will gain insight into the importance of diet as part of an overall healthy lifestyle in mitigating the impact of  genetics and other environmental insults. The purpose of this lesson is to provide patients with the opportunity to assess their personal nutrition priorities by looking at their family history, their own health history and current risk factors. Patients will also be able to discuss ways of prioritizing and modifying the [place] for their highest risk areas  Menu  Clinical staff led group instruction and group discussion with PowerPoint presentation and patient guidebook. To enhance the learning environment the use of posters, models and videos may be added. Using menus brought in from [employer], or printed from [employer], patients will apply the [place] dining out guidelines that were presented in the [employer] video. Patients will also be able to practice these guidelines in a variety of provided scenarios. The purpose of this lesson is to provide patients with the opportunity to practice hands-on learning of the [place] with actual menus and practice scenarios.  Label Reading Clinical staff led group instruction and group discussion with PowerPoint presentation and patient guidebook. To enhance the learning environment the use of posters, models and videos may be added. Patients will review and discuss the Pritikin label reading guidelines presented in Pritikin's Label Reading Educational series video. Using fool labels brought in from local grocery stores and markets, patients will apply the label reading guidelines and determine if the packaged food meet the Pritikin guidelines. The purpose of this lesson is to provide patients with the opportunity to review, discuss, and practice hands-on learning of the Pritikin Label Reading guidelines with actual packaged food labels. [place] Workshops are designed to teach patients ways to prepare quick, simple, and affordable recipes at home. The importance of  nutrition's role in chronic disease risk reduction is reflected in its emphasis in the overall Pritikin program. By learning how to prepare essential core Pritikin Eating Plan recipes, patients will increase control over what they eat; be able to customize the flavor of foods without the use of added salt, sugar, or fat; and improve the quality of the food they consume. By learning a set of core recipes which are easily assembled, quickly prepared, and affordable, patients are more likely to prepare more healthy foods at home. These workshops focus on convenient breakfasts, simple entres, side dishes, and desserts which can be prepared with minimal effort and are consistent with nutrition recommendations for cardiovascular risk reduction. Cooking [employer] are taught by a Engineer, materials (RD) who has been trained by the [employer]. The chef or RD has a clear understanding of the importance of minimizing - if not completely eliminating - added fat, sugar, and sodium in recipes. Throughout the series of [employer], patients  will learn about healthy ingredients and efficient methods of cooking to build confidence in their capability to prepare    Cooking School weekly topics:  Adding Flavor- Sodium-Free  Fast and Healthy Breakfasts  Powerhouse Plant-Based Proteins  Satisfying Salads and Dressings  Simple Sides and Sauces  International Cuisine-Spotlight on the Blue Zones  Delicious Desserts  Savory Soups  Efficiency Cooking - Meals in a Snap  Tasty Appetizers and Snacks  Comforting Weekend Breakfasts  One-Pot Wonders   Fast Evening Meals  Contractor Your Pritikin Plate  WORKSHOPS   Healthy Mindset (Psychosocial): New Thoughts, New Behaviors Clinical staff led group instruction and group discussion with PowerPoint presentation and patient guidebook. To enhance the learning environment the use of posters, models and  videos may be added. Patients will learn and practice techniques for developing effective health and lifestyle goals. Patients will be able to effectively apply the goal setting process learned to develop at least one new personal goal.  The purpose of this lesson is to expose patients to a new skill set of behavior modification techniques such as techniques setting SMART goals, overcoming barriers, and achieving new thoughts and new behaviors.  Managing Moods and Relationships Clinical staff led group instruction and group discussion with PowerPoint presentation and patient guidebook. To enhance the learning environment the use of posters, models and videos may be added. Patients will learn how emotional and chronic stress factors can impact their health and relationships. They will learn healthy ways to manage their moods and utilize positive coping mechanisms. In addition, ICR patients will learn ways to improve communication skills. The purpose of this lesson is to expose patients to ways of understanding how one's mood and health are intimately connected. Developing a healthy outlook can help build positive relationships and connections with others. Patients will understand the importance of utilizing effective communication skills that include actively listening and being heard. They will learn and understand the importance of the "4 Cs" and especially Connections in fostering of a Healthy Mind-Set.  Healthy Sleep for a Healthy Heart Clinical staff led group instruction and group discussion with PowerPoint presentation and patient guidebook. To enhance the learning environment the use of posters, models and videos may be added. At the conclusion of this workshop, patients will be able to demonstrate knowledge of the importance of sleep to overall health, well-being, and quality of life. They will understand the symptoms of, and treatments for, common sleep disorders. Patients will also be able to  identify daytime and nighttime behaviors which impact sleep, and they will be able to apply these tools to help manage sleep-related challenges. The purpose of this lesson is to provide patients with a general overview of sleep and outline the importance of quality sleep. Patients will learn about a few of the most common sleep disorders. Patients will also be introduced to the concept of "sleep hygiene," and discover ways to self-manage certain sleeping problems through simple daily behavior changes. Finally, the workshop will motivate patients by clarifying the links between quality sleep and their goals of heart-healthy living.   Recognizing and Reducing Stress Clinical staff led group instruction and group discussion with PowerPoint presentation and patient guidebook. To enhance the learning environment the use of posters, models and videos may be added. At the conclusion of this workshop, patients will be able to understand the types of stress reactions, differentiate between acute and chronic stress, and recognize the impact that chronic stress has on their health. They will also be  able to apply different coping mechanisms, such as reframing negative self-talk. Patients will have the opportunity to practice a variety of stress management techniques, such as deep abdominal breathing, progressive muscle relaxation, and/or guided imagery.  The purpose of this lesson is to educate patients on the role of stress in their lives and to provide healthy techniques for coping with it.  Learning Barriers/Preferences:  Learning Barriers/Preferences - 03/24/22 1220       Learning Barriers/Preferences   Learning Barriers Exercise Concerns;Sight;Hearing   Wears reading glasses reports feeling dizzy at times. Hard of hearing does not have hearing aides.   Learning Preferences Written Material;Pictoral             Education Topics:  Knowledge Questionnaire Score:  Knowledge Questionnaire Score - 03/24/22  1255       Knowledge Questionnaire Score   Pre Score 18/24             Core Components/Risk Factors/Patient Goals at Admission:  Personal Goals and Risk Factors at Admission - 03/24/22 1255       Core Components/Risk Factors/Patient Goals on Admission    Weight Management Yes;Obesity;Weight Loss    Intervention Weight Management/Obesity: Establish reasonable short term and long term weight goals.;Obesity: Provide education and appropriate resources to help participant work on and attain dietary goals.    Admit Weight 151 lb 7.3 oz (68.7 kg)    Expected Outcomes Short Term: Continue to assess and modify interventions until short term weight is achieved;Long Term: Adherence to nutrition and physical activity/exercise program aimed toward attainment of established weight goal;Weight Loss: Understanding of general recommendations for a balanced deficit meal plan, which promotes 1-2 lb weight loss per week and includes a negative energy balance of [phone] kcal/d    Diabetes Yes    Intervention Provide education about signs/symptoms and action to take for hypo/hyperglycemia.;Provide education about proper nutrition, including hydration, and aerobic/resistive exercise prescription along with prescribed medications to achieve blood glucose in normal ranges: Fasting glucose 65-99 mg/dL    Expected Outcomes Short Term: Participant verbalizes understanding of the signs/symptoms and immediate care of hyper/hypoglycemia, proper foot care and importance of medication, aerobic/resistive exercise and nutrition plan for blood glucose control.;Long Term: Attainment of HbA1C < 7%.    Hypertension Yes    Intervention Provide education on lifestyle modifcations including regular physical activity/exercise, weight management, moderate sodium restriction and increased consumption of fresh fruit, vegetables, and low fat dairy, alcohol moderation, and smoking cessation.;Monitor prescription use compliance.     Expected Outcomes Short Term: Continued assessment and intervention until BP is < 140/49m HG in hypertensive participants. < 130/844mHG in hypertensive participants with diabetes, heart failure or chronic kidney disease.;Long Term: Maintenance of blood pressure at goal levels.    Lipids Yes    Intervention Provide education and support for participant on nutrition & aerobic/resistive exercise along with prescribed medications to achieve LDL '70mg'$ , HDL >'40mg'$ .    Expected Outcomes Short Term: Participant states understanding of desired cholesterol values and is compliant with medications prescribed. Participant is following exercise prescription and nutrition guidelines.;Long Term: Cholesterol controlled with medications as prescribed, with individualized exercise RX and with personalized nutrition plan. Value goals: LDL < '70mg'$ , HDL > 40 mg.    Personal Goal Other Yes    Personal Goal Start exercise program. Know how much she can lift. Decrease risk for a heart attack.    Intervention Provide education on heart healthy eating, healthy mindset, and individualized exercise program including aerobic, resistance, and stretching routine  to decrease risk for future cardiac event.    Expected Outcomes Patient will understand parameters for exercise and safe lifting. Patient will make healthy lifestyle changes to decrease risk for future cardiac event/heart attack.             Core Components/Risk Factors/Patient Goals Review:    Core Components/Risk Factors/Patient Goals at Discharge (Final Review):    ITP Comments:  ITP Comments     Row Name 03/24/22 1058           ITP Comments Medical Director- Dr. Fransico Him, MD, Introduction to Pritikin Education Program/ Intensive Cardiac Rehab Orientatin Packet reviewed with the patient                Comments: Participant attended orientation for the cardiac rehabilitation program on  03/24/2022  to perform initial intake and exercise walk  test. Patient introduced to the [place] education and orientation packet was reviewed. Completed 6-minute walk test, measurements, initial ITP, and exercise prescription. Vital signs stable. Telemetry-sinus bradycardia/normal sinus rhythm, mildly symptomatic: c/o feeling lightheaded, fatigue. Symptoms resolved with rest.  Service time was from 1058 to 1255.

## 2022-03-24 NOTE — Progress Notes (Signed)
Cardiac Rehab Medication Review by a Nurse  Does the patient  feel that his/her medications are working for him/her?  yes  Has the patient been experiencing any side effects to the medications prescribed?  no  Does the patient measure his/her own blood pressure or blood glucose at home?  yes   Does the patient have any problems obtaining medications due to transportation or finances?   no  Understanding of regimen: good Understanding of indications: good Potential of compliance: good    Nurse comments: York Cerise is taking her medications as prescribed and has a good understanding of what her medications are for. Lou checks her blood pressure and CBG daily.    Christa See [facility] RN 03/24/2022 11:36 AM

## 2022-03-30 ENCOUNTER — Encounter (HOSPITAL_COMMUNITY): Payer: Medicare Other

## 2022-04-01 ENCOUNTER — Encounter (HOSPITAL_COMMUNITY)
Admission: RE | Admit: 2022-04-01 | Discharge: 2022-04-01 | Disposition: A | Discharge status: Home / Self Care | Payer: Medicare Other | Source: Ambulatory Visit | Attending: Cardiology | Admitting: Cardiology

## 2022-04-01 DIAGNOSIS — Z9861 Coronary angioplasty status: Secondary | ICD-10-CM | POA: Diagnosis not present

## 2022-04-01 DIAGNOSIS — I2089 Other forms of angina pectoris: Secondary | ICD-10-CM | POA: Diagnosis not present

## 2022-04-01 DIAGNOSIS — I10 Essential (primary) hypertension: Secondary | ICD-10-CM | POA: Diagnosis not present

## 2022-04-01 DIAGNOSIS — Z48812 Encounter for surgical aftercare following surgery on the circulatory system: Secondary | ICD-10-CM | POA: Diagnosis not present

## 2022-04-01 LAB — GLUCOSE, CAPILLARY
Glucose-Capillary: 175 mg/dL — ABNORMAL HIGH (ref 70–99)
Glucose-Capillary: 156 mg/dL — ABNORMAL HIGH (ref 70–99)

## 2022-04-01 NOTE — Progress Notes (Signed)
Cardiac Individual Treatment Plan  Patient Details  Name: Victoria Ferguson MRN: [id number] Date of Birth: [birth year] Referring Provider:   Flowsheet Row INTENSIVE CARDIAC REHAB ORIENT from 03/24/2022 in [facility] for Heart, Vascular, & [place]  Referring Provider Lelon Perla, MD       Initial Encounter Date:  [place] from 03/24/2022 in [facility] for Heart, Vascular, & Lung [facility]  Date 03/24/22       Visit Diagnosis: Chronic stable angina  Patient's Home Medications on Admission:  Current Outpatient Medications:    aspirin EC 81 MG tablet, Take 1 tablet (81 mg total) by mouth daily. Swallow whole. (Patient taking differently: Take 81 mg by mouth at bedtime. Swallow whole.), Disp: 90 tablet, Rfl: 3   ergocalciferol (VITAMIN D2) 1.25 MG (50000 UT) capsule, Take 50,000 Units by mouth every Monday. WITH LUNCH, Disp: , Rfl:    ezetimibe (ZETIA) 10 MG tablet, Take 1 tablet (10 mg total) by mouth daily. (Patient taking differently: Take 10 mg by mouth daily with lunch.), Disp: 90 tablet, Rfl: 3   ferrous sulfate 325 (65 FE) MG tablet, Take 325 mg by mouth every 3 (three) days., Disp: , Rfl:    FREESTYLE LITE test strip, , Disp: , Rfl:    glipiZIDE (GLUCOTROL) 5 MG tablet, Take 5 mg by mouth daily with lunch., Disp: , Rfl:    hydrochlorothiazide (HYDRODIURIL) 25 MG tablet, Take 25 mg by mouth daily with lunch., Disp: , Rfl:    levothyroxine (SYNTHROID) 75 MCG tablet, Take 75 mcg by mouth daily., Disp: , Rfl:    losartan (COZAAR) 50 MG tablet, Take 50 mg by mouth daily with lunch., Disp: , Rfl:    metoprolol succinate (TOPROL XL) 25 MG 24 hr tablet, Take 1 tablet (25 mg total) by mouth daily. (Patient taking differently: Take 25 mg by mouth at bedtime.), Disp: 90 tablet, Rfl: 3   nitroGLYCERIN (NITROSTAT) 0.4 MG SL tablet, Place 1 tablet (0.4 mg total) under the tongue every 5 (five) minutes as  needed for chest pain., Disp: 25 tablet, Rfl: 3   potassium chloride (KLOR-CON M) 10 MEQ tablet, Take 10 mEq by mouth daily with lunch., Disp: , Rfl:    rosuvastatin (CRESTOR) 40 MG tablet, Take 1 tablet (40 mg total) by mouth daily., Disp: 90 tablet, Rfl: 3  Past Medical History: Past Medical History:  Diagnosis Date   Diabetes mellitus    Hyperlipemia    Hypertension    Hypothyroidism    Lung nodule     Tobacco Use: Social History   Tobacco Use  Smoking Status Former   Types: Cigarettes  Smokeless Tobacco Not on file    Labs: Review Flowsheet       Latest Ref Rng & Units 07/07/2021 09/24/2021  Labs for ITP Cardiac and Pulmonary Rehab  Cholestrol 100 - 199 mg/dL 159  116   LDL (calc) 0 - 99 mg/dL 72  41   Direct LDL 0 - 99 mg/dL - 39   HDL-C >39 mg/dL 60  49   Trlycerides 0 - 149 mg/dL 157  156     Capillary Blood Glucose: Lab Results  Component Value Date   GLUCAP 195 (H) 03/24/2022   GLUCAP 145 (H) 12/19/2021   GLUCAP 188 (H) 12/19/2021     Exercise Target Goals: Exercise Program Goal: Individual exercise prescription set using results from initial 6 min walk test and THRR while considering  patient's activity barriers and safety.   Exercise Prescription Goal: Initial exercise prescription builds to 30-45 minutes a day of aerobic activity, 2-3 days per week.  Home exercise guidelines will be given to patient during program as part of exercise prescription that the participant will acknowledge.  Activity Barriers & Risk Stratification:  Activity Barriers & Cardiac Risk Stratification - 03/24/22 1209       Activity Barriers & Cardiac Risk Stratification   Activity Barriers Other (comment)    Comments Chronic knee pain, occassional bilateral ankle edema, and bilateral leg pain in the mornings.    Cardiac Risk Stratification High             6 Minute Walk:  6 Minute Walk     Row Name 03/24/22 1154         6 Minute Walk   Phase Initial      Distance 863 feet     Walk Time 6 minutes     # of Rest Breaks 1  Seated rest break at 3 mins 8 secs, restart at 4 mins 11 secs.     MPH 1.63     METS 1.23     RPE 11     Perceived Dyspnea  0     VO2 Peak 4.31     Symptoms Yes (comment)     Comments Patient c/o feeling lightheaded, which she thinks was related to wearing her reading glasses, resolved with rest and removal of readers. Seated rest break taken due to fatigue.     Resting HR 56 bpm     Resting BP 108/72     Resting Oxygen Saturation  97 %     Exercise Oxygen Saturation  during 6 min walk 99 %     Max Ex. HR 86 bpm     Max Ex. BP 138/60     2 Minute Post BP 112/64              Oxygen Initial Assessment:   Oxygen Re-Evaluation:   Oxygen Discharge (Final Oxygen Re-Evaluation):   Initial Exercise Prescription:  Initial Exercise Prescription - 03/24/22 1300       Date of Initial Exercise RX and Referring Provider   Date 03/24/22    Referring Provider Lelon Perla, MD    Expected Discharge Date 05/22/22      NuStep   Level 1    SPM 75    Minutes 25    METs 1.5      Prescription Details   Frequency (times per week) 2    Duration Progress to 30 minutes of continuous aerobic without signs/symptoms of physical distress      Intensity   THRR 40-80% of Max Heartrate 62-125    Ratings of Perceived Exertion 11-13    Perceived Dyspnea 0-4      Progression   Progression Continue to progress workloads to maintain intensity without signs/symptoms of physical distress.      Resistance Training   Training Prescription Yes    Weight 1 lb    Reps 10-15             Perform Capillary Blood Glucose checks as needed.  Exercise Prescription Changes:   Exercise Comments:   Exercise Goals and Review:   Exercise Goals     Row Name 03/24/22 1209             Exercise Goals   Increase Physical Activity Yes       Intervention Provide advice,  education, support and counseling about physical  activity/exercise needs.;Develop an individualized exercise prescription for aerobic and resistive training based on initial evaluation findings, risk stratification, comorbidities and participant's personal goals.       Expected Outcomes Short Term: Attend rehab on a regular basis to increase amount of physical activity.;Long Term: Add in home exercise to make exercise part of routine and to increase amount of physical activity.;Long Term: Exercising regularly at least 3-5 days a week.       Increase Strength and Stamina Yes       Intervention Provide advice, education, support and counseling about physical activity/exercise needs.;Develop an individualized exercise prescription for aerobic and resistive training based on initial evaluation findings, risk stratification, comorbidities and participant's personal goals.       Expected Outcomes Short Term: Increase workloads from initial exercise prescription for resistance, speed, and METs.;Short Term: Perform resistance training exercises routinely during rehab and add in resistance training at home;Long Term: Improve cardiorespiratory fitness, muscular endurance and strength as measured by increased METs and functional capacity (6MWT)       Able to understand and use rate of perceived exertion (RPE) scale Yes       Intervention Provide education and explanation on how to use RPE scale       Expected Outcomes Short Term: Able to use RPE daily in rehab to express subjective intensity level;Long Term:  Able to use RPE to guide intensity level when exercising independently       Knowledge and understanding of Target Heart Rate Range (THRR) Yes       Intervention Provide education and explanation of THRR including how the numbers were predicted and where they are located for reference       Expected Outcomes Short Term: Able to state/look up THRR;Long Term: Able to use THRR to govern intensity when exercising independently;Short Term: Able to use daily as  guideline for intensity in rehab       Able to check pulse independently Yes       Intervention Provide education and demonstration on how to check pulse in carotid and radial arteries.;Review the importance of being able to check your own pulse for safety during independent exercise       Expected Outcomes Long Term: Able to check pulse independently and accurately;Short Term: Able to explain why pulse checking is important during independent exercise       Understanding of Exercise Prescription Yes       Intervention Provide education, explanation, and written materials on patient's individual exercise prescription       Expected Outcomes Short Term: Able to explain program exercise prescription;Long Term: Able to explain home exercise prescription to exercise independently                Exercise Goals Re-Evaluation :   Discharge Exercise Prescription (Final Exercise Prescription Changes):   Nutrition:  Target Goals: Understanding of nutrition guidelines, daily intake of sodium '1500mg'$ , cholesterol '200mg'$ , calories 30% from fat and 7% or less from saturated fats, daily to have 5 or more servings of fruits and vegetables.  Biometrics:  Pre Biometrics - 03/24/22 1058       Pre Biometrics   Waist Circumference 36.75 inches    Hip Circumference 44.25 inches    Waist to Hip Ratio 0.83 %    Triceps Skinfold 34 mm    % Body Fat 43.7 %    Grip Strength 12 kg    Flexibility 11 in    Single  Leg Stand 7.18 seconds              Nutrition Therapy Plan and Nutrition Goals:   Nutrition Assessments:  MEDIFICTS Score Key: ?70 Need to make dietary changes  40-70 Heart Healthy Diet ? 40 Therapeutic Level Cholesterol Diet    Picture Your Plate Scores: <25 Unhealthy dietary pattern with much room for improvement. 41-50 Dietary pattern unlikely to meet recommendations for good health and room for improvement. 51-60 More healthful dietary pattern, with some room for improvement.   >60 Healthy dietary pattern, although there may be some specific behaviors that could be improved.    Nutrition Goals Re-Evaluation:   Nutrition Goals Re-Evaluation:   Nutrition Goals Discharge (Final Nutrition Goals Re-Evaluation):   Psychosocial: Target Goals: Acknowledge presence or absence of significant depression and/or stress, maximize coping skills, provide positive support system. Participant is able to verbalize types and ability to use techniques and skills needed for reducing stress and depression.  Initial Review & Psychosocial Screening:  Initial Psych Review & Screening - 03/24/22 1218       Initial Review   Current issues with Current Stress Concerns;History of Depression    Source of Stress Concerns Family;Unable to participate in former interests or hobbies;Unable to perform yard/household activities    Comments Victoria Ferguson denies being depressed currenlty but says she has felt depressed at times as her husband has poor mobility one of Lous son's has dementia.      Family Dynamics   Good Support System? Yes   Lous has her husband and children for support     Barriers   Psychosocial barriers to participate in program The patient should benefit from training in stress management and relaxation.      Screening Interventions   Interventions Encouraged to exercise;To provide support and resources with identified psychosocial needs    Expected Outcomes Long Term Goal: Stressors or current issues are controlled or eliminated.             Quality of Life Scores:  Quality of Life - 03/24/22 1254       Quality of Life   Select Quality of Life      Quality of Life Scores   Health/Function Pre 25.2 %    Socioeconomic Pre 24.43 %    Psych/Spiritual Pre 20.57 %    Family Pre 24 %    GLOBAL Pre 23.91 %            Scores of 19 and below usually indicate a poorer quality of life in these areas.  A difference of  2-3 points is a clinically meaningful difference.   A difference of 2-3 points in the total score of the Quality of Life Index has been associated with significant improvement in overall quality of life, self-image, physical symptoms, and general health in studies assessing change in quality of life.  PHQ-9: Review Flowsheet       03/24/2022  Depression screen PHQ 2/9  Decreased Interest 0  Down, Depressed, Hopeless 0  PHQ - 2 Score 0   Interpretation of Total Score  Total Score Depression Severity:  1-4 = Minimal depression, 5-9 = Mild depression, 10-14 = Moderate depression, 15-19 = Moderately severe depression, 20-27 = Severe depression   Psychosocial Evaluation and Intervention:   Psychosocial Re-Evaluation:   Psychosocial Discharge (Final Psychosocial Re-Evaluation):   Vocational Rehabilitation: Provide vocational rehab assistance to qualifying candidates.   Vocational Rehab Evaluation & Intervention:  Vocational Rehab - 03/24/22 8527  Initial Vocational Rehab Evaluation & Intervention   Assessment shows need for Vocational Rehabilitation No   Victoria Ferguson is reitred and does not need vocational rehab at this time            Education: Education Goals: Education classes will be provided on a weekly basis, covering required topics. Participant will state understanding/return demonstration of topics presented.     Core Videos: Exercise    Move It!  Clinical staff conducted group or individual video education with verbal and written material and guidebook.  Patient learns the recommended Pritikin exercise program. Exercise with the goal of living a long, healthy life. Some of the health benefits of exercise include controlled diabetes, healthier blood pressure levels, improved cholesterol levels, improved heart and lung capacity, improved sleep, and better body composition. Everyone should speak with their doctor before starting or changing an exercise routine.  Biomechanical Limitations Clinical staff conducted  group or individual video education with verbal and written material and guidebook.  Patient learns how biomechanical limitations can impact exercise and how we can mitigate and possibly overcome limitations to have an impactful and balanced exercise routine.  Body Composition Clinical staff conducted group or individual video education with verbal and written material and guidebook.  Patient learns that body composition (ratio of muscle mass to fat mass) is a key component to assessing overall fitness, rather than body weight alone. Increased fat mass, especially visceral belly fat, can put [place] at increased risk for metabolic syndrome, type 2 diabetes, heart disease, and even death. It is recommended to combine diet and exercise (cardiovascular and resistance training) to improve your body composition. Seek guidance from your physician and exercise physiologist before implementing an exercise routine.  Exercise Action Plan Clinical staff conducted group or individual video education with verbal and written material and guidebook.  Patient learns the recommended strategies to achieve and enjoy long-term exercise adherence, including variety, self-motivation, self-efficacy, and positive decision making. Benefits of exercise include fitness, good health, weight management, more energy, better sleep, less stress, and overall well-being.  Medical   Heart Disease Risk Reduction Clinical staff conducted group or individual video education with verbal and written material and guidebook.  Patient learns our heart is our most vital organ as it circulates oxygen, nutrients, white blood cells, and hormones throughout the entire body, and carries waste away. Data supports a plant-based eating plan like the Pritikin Program for its effectiveness in slowing progression of and reversing heart disease. The video provides a number of recommendations to address heart disease.   Metabolic Syndrome and Belly Fat   Clinical staff conducted group or individual video education with verbal and written material and guidebook.  Patient learns what metabolic syndrome is, how it leads to heart disease, and how one can reverse it and keep it from coming back. You have metabolic syndrome if you have 3 of the following 5 criteria: abdominal obesity, high blood pressure, high triglycerides, low HDL cholesterol, and high blood sugar.  Hypertension and Heart Disease Clinical staff conducted group or individual video education with verbal and written material and guidebook.  Patient learns that high blood pressure, or hypertension, is very common in the [place]. Hypertension is largely due to excessive salt intake, but other important risk factors include being overweight, physical inactivity, drinking too much alcohol, smoking, and not eating enough potassium from fruits and vegetables. High blood pressure is a leading risk factor for heart attack, stroke, congestive heart failure, dementia, kidney failure, and premature death. Long-term  effects of excessive salt intake include stiffening of the arteries and thickening of heart muscle and organ damage. Recommendations include ways to reduce hypertension and the risk of heart disease.  Diseases of Our Time - Focusing on Diabetes Clinical staff conducted group or individual video education with verbal and written material and guidebook.  Patient learns why the best way to stop diseases of our time is prevention, through food and other lifestyle changes. Medicine (such as prescription pills and surgeries) is often only a Band-Aid on the problem, not a long-term solution. Most common diseases of our time include obesity, type 2 diabetes, hypertension, heart disease, and cancer. The Pritikin Program is recommended and has been proven to help reduce, reverse, and/or prevent the damaging effects of metabolic syndrome.  Nutrition   Overview of the Pritikin Eating Plan   Clinical staff conducted group or individual video education with verbal and written material and guidebook.  Patient learns about the [place] for disease risk reduction. The [place] emphasizes a wide variety of unrefined, minimally-processed carbohydrates, like fruits, vegetables, whole grains, and legumes. Go, Caution, and Stop food choices are explained. Plant-based and lean animal proteins are emphasized. Rationale provided for low sodium intake for blood pressure control, low added sugars for blood sugar stabilization, and low added fats and oils for coronary artery disease risk reduction and weight management.  Calorie Density  Clinical staff conducted group or individual video education with verbal and written material and guidebook.  Patient learns about calorie density and how it impacts the Pritikin Eating Plan. Knowing the characteristics of the food you choose will help you decide whether those foods will lead to weight gain or weight loss, and whether you want to consume more or less of them. Weight loss is usually a side effect of the Pritikin Eating Plan because of its focus on low calorie-dense foods.  Label Reading  Clinical staff conducted group or individual video education with verbal and written material and guidebook.  Patient learns about the Pritikin recommended label reading guidelines and corresponding recommendations regarding calorie density, added sugars, sodium content, and whole grains.  Dining Out - Part 1  Clinical staff conducted group or individual video education with verbal and written material and guidebook.  Patient learns that restaurant meals can be sabotaging because they can be so high in calories, fat, sodium, and/or sugar. Patient learns recommended strategies on how to positively address this and avoid unhealthy pitfalls.  Facts on Fats  Clinical staff conducted group or individual video education with verbal and written  material and guidebook.  Patient learns that lifestyle modifications can be just as effective, if not more so, as many medications for lowering your risk of heart disease. A Pritikin lifestyle can help to reduce your risk of inflammation and atherosclerosis (cholesterol build-up, or plaque, in the artery walls). Lifestyle interventions such as dietary choices and physical activity address the cause of atherosclerosis. A review of the types of fats and their impact on blood cholesterol levels, along with dietary recommendations to reduce fat intake is also included.  Nutrition Action Plan  Clinical staff conducted group or individual video education with verbal and written material and guidebook.  Patient learns how to incorporate Pritikin recommendations into their lifestyle. Recommendations include planning and keeping personal health goals in mind as an important part of their success.  Healthy Mind-Set    Healthy Minds, Bodies, Hearts  Clinical staff conducted group or individual video education with verbal and written material  and guidebook.  Patient learns how to identify when they are stressed. Video will discuss the impact of that stress, as well as the many benefits of stress management. Patient will also be introduced to stress management techniques. The way we think, act, and feel has an impact on our hearts.  How Our Thoughts Can Heal Our Hearts  Clinical staff conducted group or individual video education with verbal and written material and guidebook.  Patient learns that negative thoughts can cause depression and anxiety. This can result in negative lifestyle behavior and serious health problems. Cognitive behavioral therapy is an effective method to help control our thoughts in order to change and improve our emotional outlook.  Additional Videos:  Exercise    Improving Performance  Clinical staff conducted group or individual video education with verbal and written material and  guidebook.  Patient learns to use a non-linear approach by alternating intensity levels and lengths of time spent exercising to help burn more calories and lose more body fat. Cardiovascular exercise helps improve heart health, metabolism, hormonal balance, blood sugar control, and recovery from fatigue. Resistance training improves strength, endurance, balance, coordination, reaction time, metabolism, and muscle mass. Flexibility exercise improves circulation, posture, and balance. Seek guidance from your physician and exercise physiologist before implementing an exercise routine and learn your capabilities and proper form for all exercise.  Introduction to Yoga  Clinical staff conducted group or individual video education with verbal and written material and guidebook.  Patient learns about yoga, a discipline of the coming together of mind, breath, and body. The benefits of yoga include improved flexibility, improved range of motion, better posture and core strength, increased lung function, weight loss, and positive self-image. Yoga's heart health benefits include lowered blood pressure, healthier heart rate, decreased cholesterol and triglyceride levels, improved immune function, and reduced stress. Seek guidance from your physician and exercise physiologist before implementing an exercise routine and learn your capabilities and proper form for all exercise.  Medical   Aging: Enhancing Your Quality of Life  Clinical staff conducted group or individual video education with verbal and written material and guidebook.  Patient learns key strategies and recommendations to stay in good physical health and enhance quality of life, such as prevention strategies, having an advocate, securing a [place], and keeping a list of medications and system for tracking them. It also discusses how to avoid risk for bone loss.  Biology of Weight Control  Clinical staff conducted group or  individual video education with verbal and written material and guidebook.  Patient learns that weight gain occurs because we consume more calories than we burn (eating more, moving less). Even if your body weight is normal, you may have higher ratios of fat compared to muscle mass. Too much body fat puts you at increased risk for cardiovascular disease, heart attack, stroke, type 2 diabetes, and obesity-related cancers. In addition to exercise, following the [place] can help reduce your risk.  Decoding Lab Results  Clinical staff conducted group or individual video education with verbal and written material and guidebook.  Patient learns that lab test reflects one measurement whose values change over time and are influenced by many factors, including medication, stress, sleep, exercise, food, hydration, pre-existing medical conditions, and more. It is recommended to use the knowledge from this video to become more involved with your lab results and evaluate your numbers to speak with your doctor.   Diseases of Our Time - Overview  Clinical staff conducted group or individual video education with verbal and written material and guidebook.  Patient learns that according to the CDC, 50% to 70% of chronic diseases (such as obesity, type 2 diabetes, elevated lipids, hypertension, and heart disease) are avoidable through lifestyle improvements including healthier food choices, listening to satiety cues, and increased physical activity.  Sleep Disorders Clinical staff conducted group or individual video education with verbal and written material and guidebook.  Patient learns how good quality and duration of sleep are important to overall health and well-being. Patient also learns about sleep disorders and how they impact health along with recommendations to address them, including discussing with a physician.  Nutrition  Dining Out - Part 2 Clinical staff conducted group or individual video  education with verbal and written material and guidebook.  Patient learns how to plan ahead and communicate in order to maximize their dining experience in a healthy and nutritious manner. Included are recommended food choices based on the type of restaurant the patient is visiting.   Fueling a Best boy conducted group or individual video education with verbal and written material and guidebook.  There is a strong connection between our food choices and our health. Diseases like obesity and type 2 diabetes are very prevalent and are in large-part due to lifestyle choices. The Pritikin Eating Plan provides plenty of food and hunger-curbing satisfaction. It is easy to follow, affordable, and helps reduce health risks.  Menu Workshop  Clinical staff conducted group or individual video education with verbal and written material and guidebook.  Patient learns that restaurant meals can sabotage health goals because they are often packed with calories, fat, sodium, and sugar. Recommendations include strategies to plan ahead and to communicate with the manager, chef, or server to help order a healthier meal.  Planning Your Eating Strategy  Clinical staff conducted group or individual video education with verbal and written material and guidebook.  Patient learns about the [place] and its benefit of reducing the risk of disease. The [place] does not focus on calories. Instead, it emphasizes high-quality, nutrient-rich foods. By knowing the characteristics of the foods, we choose, we can determine their calorie density and make informed decisions.  Targeting Your Nutrition Priorities  Clinical staff conducted group or individual video education with verbal and written material and guidebook.  Patient learns that lifestyle habits have a tremendous impact on disease risk and progression. This video provides eating and physical activity recommendations based on your  personal health goals, such as reducing LDL cholesterol, losing weight, preventing or controlling type 2 diabetes, and reducing high blood pressure.  Vitamins and Minerals  Clinical staff conducted group or individual video education with verbal and written material and guidebook.  Patient learns different ways to obtain key vitamins and minerals, including through a recommended healthy diet. It is important to discuss all supplements you take with your doctor.   Healthy Mind-Set    Smoking Cessation  Clinical staff conducted group or individual video education with verbal and written material and guidebook.  Patient learns that cigarette smoking and tobacco addiction pose a serious health risk which affects millions of people. Stopping smoking will significantly reduce the risk of heart disease, lung disease, and many forms of cancer. Recommended strategies for quitting are covered, including working with your doctor to develop a successful plan.  Culinary   Becoming a Financial trader conducted group or individual video education with verbal and written  material and guidebook.  Patient learns that cooking at home can be healthy, cost-effective, quick, and puts them in control. Keys to cooking healthy recipes will include looking at your recipe, assessing your equipment needs, planning ahead, making it simple, choosing cost-effective seasonal ingredients, and limiting the use of added fats, salts, and sugars.  Cooking - Breakfast and Snacks  Clinical staff conducted group or individual video education with verbal and written material and guidebook.  Patient learns how important breakfast is to satiety and nutrition through the entire day. Recommendations include key foods to eat during breakfast to help stabilize blood sugar levels and to prevent overeating at meals later in the day. Planning ahead is also a key component.  Cooking - Human resources officer conducted  group or individual video education with verbal and written material and guidebook.  Patient learns eating strategies to improve overall health, including an approach to cook more at home. Recommendations include thinking of animal protein as a side on your plate rather than center stage and focusing instead on lower calorie dense options like vegetables, fruits, whole grains, and plant-based proteins, such as beans. Making sauces in large quantities to freeze for later and leaving the skin on your vegetables are also recommended to maximize your experience.  Cooking - Healthy Salads and Dressing Clinical staff conducted group or individual video education with verbal and written material and guidebook.  Patient learns that vegetables, fruits, whole grains, and legumes are the foundations of the [place]. Recommendations include how to incorporate each of these in flavorful and healthy salads, and how to create homemade salad dressings. Proper handling of ingredients is also covered. Cooking - Soups and [employer] - Soups and Desserts Clinical staff conducted group or individual video education with verbal and written material and guidebook.  Patient learns that Pritikin soups and desserts make for easy, nutritious, and delicious snacks and meal components that are low in sodium, fat, sugar, and calorie density, while high in vitamins, minerals, and filling fiber. Recommendations include simple and healthy ideas for soups and desserts.   Overview     The Pritikin Solution Program Overview Clinical staff conducted group or individual video education with verbal and written material and guidebook.  Patient learns that the results of the [place] Program have been documented in more than 100 articles published in peer-reviewed journals, and the benefits include reducing risk factors for (and, in some cases, even reversing) high cholesterol, high blood pressure, type 2 diabetes, obesity,  and more! An overview of the three key pillars of the Pritikin Program will be covered: eating well, doing regular exercise, and having a healthy mind-set.  WORKSHOPS  Exercise: Exercise Basics: Building Your Action Plan Clinical staff led group instruction and group discussion with PowerPoint presentation and patient guidebook. To enhance the learning environment the use of posters, models and videos may be added. At the conclusion of this workshop, patients will comprehend the difference between physical activity and exercise, as well as the benefits of incorporating both, into their routine. Patients will understand the FITT (Frequency, Intensity, Time, and Type) principle and how to use it to build an exercise action plan. In addition, safety concerns and other considerations for exercise and cardiac rehab will be addressed by the presenter. The purpose of this lesson is to promote a comprehensive and effective weekly exercise routine in order to improve patients' overall level of fitness.   Managing Heart Disease: Your Path to a Healthier Heart Clinical  staff led group instruction and group discussion with PowerPoint presentation and patient guidebook. To enhance the learning environment the use of posters, models and videos may be added.At the conclusion of this workshop, patients will understand the anatomy and physiology of the heart. Additionally, they will understand how Pritikin's three pillars impact the risk factors, the progression, and the management of heart disease.  The purpose of this lesson is to provide a high-level overview of the heart, heart disease, and how the Pritikin lifestyle positively impacts risk factors.  Exercise Biomechanics Clinical staff led group instruction and group discussion with PowerPoint presentation and patient guidebook. To enhance the learning environment the use of posters, models and videos may be added. Patients will learn how the structural  parts of their bodies function and how these functions impact their daily activities, movement, and exercise. Patients will learn how to promote a neutral spine, learn how to manage pain, and identify ways to improve their physical movement in order to promote healthy living. The purpose of this lesson is to expose patients to common physical limitations that impact physical activity. Participants will learn practical ways to adapt and manage aches and pains, and to minimize their effect on regular exercise. Patients will learn how to maintain good posture while sitting, walking, and lifting.  Balance Training and Fall Prevention  Clinical staff led group instruction and group discussion with PowerPoint presentation and patient guidebook. To enhance the learning environment the use of posters, models and videos may be added. At the conclusion of this workshop, patients will understand the importance of their sensorimotor skills (vision, proprioception, and the vestibular system) in maintaining their ability to balance as they age. Patients will apply a variety of balancing exercises that are appropriate for their current level of function. Patients will understand the common causes for poor balance, possible solutions to these problems, and ways to modify their physical environment in order to minimize their fall risk. The purpose of this lesson is to teach patients about the importance of maintaining balance as they age and ways to minimize their risk of falling.  WORKSHOPS   Nutrition:  Fueling a Scientist, research (physical sciences) led group instruction and group discussion with PowerPoint presentation and patient guidebook. To enhance the learning environment the use of posters, models and videos may be added. Patients will review the foundational principles of the [place] and understand what constitutes a serving size in each of the food groups. Patients will also learn Pritikin-friendly  foods that are better choices when away from home and review make-ahead meal and snack options. Calorie density will be reviewed and applied to three nutrition priorities: weight maintenance, weight loss, and weight gain. The purpose of this lesson is to reinforce (in a group setting) the key concepts around what patients are recommended to eat and how to apply these guidelines when away from home by planning and selecting Pritikin-friendly options. Patients will understand how calorie density may be adjusted for different weight management goals.  Mindful Eating  Clinical staff led group instruction and group discussion with PowerPoint presentation and patient guidebook. To enhance the learning environment the use of posters, models and videos may be added. Patients will briefly review the concepts of the [place] and the importance of low-calorie dense foods. The concept of mindful eating will be introduced as well as the importance of paying attention to internal hunger signals. Triggers for non-hunger eating and techniques for dealing with triggers will be explored. The purpose  of this lesson is to provide patients with the opportunity to review the basic principles of the [place], discuss the value of eating mindfully and how to measure internal cues of hunger and fullness using the Hunger Scale. Patients will also discuss reasons for non-hunger eating and learn strategies to use for controlling emotional eating.  Targeting Your Nutrition Priorities Clinical staff led group instruction and group discussion with PowerPoint presentation and patient guidebook. To enhance the learning environment the use of posters, models and videos may be added. Patients will learn how to determine their genetic susceptibility to disease by reviewing their family history. Patients will gain insight into the importance of diet as part of an overall healthy lifestyle in mitigating the impact of  genetics and other environmental insults. The purpose of this lesson is to provide patients with the opportunity to assess their personal nutrition priorities by looking at their family history, their own health history and current risk factors. Patients will also be able to discuss ways of prioritizing and modifying the [place] for their highest risk areas  Menu  Clinical staff led group instruction and group discussion with PowerPoint presentation and patient guidebook. To enhance the learning environment the use of posters, models and videos may be added. Using menus brought in from [employer], or printed from [employer], patients will apply the [place] dining out guidelines that were presented in the [employer] video. Patients will also be able to practice these guidelines in a variety of provided scenarios. The purpose of this lesson is to provide patients with the opportunity to practice hands-on learning of the [place] with actual menus and practice scenarios.  Label Reading Clinical staff led group instruction and group discussion with PowerPoint presentation and patient guidebook. To enhance the learning environment the use of posters, models and videos may be added. Patients will review and discuss the Pritikin label reading guidelines presented in Pritikin's Label Reading Educational series video. Using fool labels brought in from local grocery stores and markets, patients will apply the label reading guidelines and determine if the packaged food meet the Pritikin guidelines. The purpose of this lesson is to provide patients with the opportunity to review, discuss, and practice hands-on learning of the Pritikin Label Reading guidelines with actual packaged food labels. [place] Workshops are designed to teach patients ways to prepare quick, simple, and affordable recipes at home. The importance of  nutrition's role in chronic disease risk reduction is reflected in its emphasis in the overall Pritikin program. By learning how to prepare essential core Pritikin Eating Plan recipes, patients will increase control over what they eat; be able to customize the flavor of foods without the use of added salt, sugar, or fat; and improve the quality of the food they consume. By learning a set of core recipes which are easily assembled, quickly prepared, and affordable, patients are more likely to prepare more healthy foods at home. These workshops focus on convenient breakfasts, simple entres, side dishes, and desserts which can be prepared with minimal effort and are consistent with nutrition recommendations for cardiovascular risk reduction. Cooking [employer] are taught by a Engineer, materials (RD) who has been trained by the [employer]. The chef or RD has a clear understanding of the importance of minimizing - if not completely eliminating - added fat, sugar, and sodium in recipes. Throughout the series of [employer], patients  will learn about healthy ingredients and efficient methods of cooking to build confidence in their capability to prepare    Cooking School weekly topics:  Adding Flavor- Sodium-Free  Fast and Healthy Breakfasts  Powerhouse Plant-Based Proteins  Satisfying Salads and Dressings  Simple Sides and Sauces  International Cuisine-Spotlight on the Blue Zones  Delicious Desserts  Savory Soups  Efficiency Cooking - Meals in a Snap  Tasty Appetizers and Snacks  Comforting Weekend Breakfasts  One-Pot Wonders   Fast Evening Meals  Contractor Your Pritikin Plate  WORKSHOPS   Healthy Mindset (Psychosocial): New Thoughts, New Behaviors Clinical staff led group instruction and group discussion with PowerPoint presentation and patient guidebook. To enhance the learning environment the use of posters, models and  videos may be added. Patients will learn and practice techniques for developing effective health and lifestyle goals. Patients will be able to effectively apply the goal setting process learned to develop at least one new personal goal.  The purpose of this lesson is to expose patients to a new skill set of behavior modification techniques such as techniques setting SMART goals, overcoming barriers, and achieving new thoughts and new behaviors.  Managing Moods and Relationships Clinical staff led group instruction and group discussion with PowerPoint presentation and patient guidebook. To enhance the learning environment the use of posters, models and videos may be added. Patients will learn how emotional and chronic stress factors can impact their health and relationships. They will learn healthy ways to manage their moods and utilize positive coping mechanisms. In addition, ICR patients will learn ways to improve communication skills. The purpose of this lesson is to expose patients to ways of understanding how one's mood and health are intimately connected. Developing a healthy outlook can help build positive relationships and connections with others. Patients will understand the importance of utilizing effective communication skills that include actively listening and being heard. They will learn and understand the importance of the "4 Cs" and especially Connections in fostering of a Healthy Mind-Set.  Healthy Sleep for a Healthy Heart Clinical staff led group instruction and group discussion with PowerPoint presentation and patient guidebook. To enhance the learning environment the use of posters, models and videos may be added. At the conclusion of this workshop, patients will be able to demonstrate knowledge of the importance of sleep to overall health, well-being, and quality of life. They will understand the symptoms of, and treatments for, common sleep disorders. Patients will also be able to  identify daytime and nighttime behaviors which impact sleep, and they will be able to apply these tools to help manage sleep-related challenges. The purpose of this lesson is to provide patients with a general overview of sleep and outline the importance of quality sleep. Patients will learn about a few of the most common sleep disorders. Patients will also be introduced to the concept of "sleep hygiene," and discover ways to self-manage certain sleeping problems through simple daily behavior changes. Finally, the workshop will motivate patients by clarifying the links between quality sleep and their goals of heart-healthy living.   Recognizing and Reducing Stress Clinical staff led group instruction and group discussion with PowerPoint presentation and patient guidebook. To enhance the learning environment the use of posters, models and videos may be added. At the conclusion of this workshop, patients will be able to understand the types of stress reactions, differentiate between acute and chronic stress, and recognize the impact that chronic stress has on their health. They will also be  able to apply different coping mechanisms, such as reframing negative self-talk. Patients will have the opportunity to practice a variety of stress management techniques, such as deep abdominal breathing, progressive muscle relaxation, and/or guided imagery.  The purpose of this lesson is to educate patients on the role of stress in their lives and to provide healthy techniques for coping with it.  Learning Barriers/Preferences:  Learning Barriers/Preferences - 03/24/22 1220       Learning Barriers/Preferences   Learning Barriers Exercise Concerns;Sight;Hearing   Wears reading glasses reports feeling dizzy at times. Hard of hearing does not have hearing aides.   Learning Preferences Written Material;Pictoral             Education Topics:  Knowledge Questionnaire Score:  Knowledge Questionnaire Score - 03/24/22  1255       Knowledge Questionnaire Score   Pre Score 18/24             Core Components/Risk Factors/Patient Goals at Admission:  Personal Goals and Risk Factors at Admission - 03/24/22 1255       Core Components/Risk Factors/Patient Goals on Admission    Weight Management Yes;Obesity;Weight Loss    Intervention Weight Management/Obesity: Establish reasonable short term and long term weight goals.;Obesity: Provide education and appropriate resources to help participant work on and attain dietary goals.    Admit Weight 151 lb 7.3 oz (68.7 kg)    Expected Outcomes Short Term: Continue to assess and modify interventions until short term weight is achieved;Long Term: Adherence to nutrition and physical activity/exercise program aimed toward attainment of established weight goal;Weight Loss: Understanding of general recommendations for a balanced deficit meal plan, which promotes 1-2 lb weight loss per week and includes a negative energy balance of [phone] kcal/d    Diabetes Yes    Intervention Provide education about signs/symptoms and action to take for hypo/hyperglycemia.;Provide education about proper nutrition, including hydration, and aerobic/resistive exercise prescription along with prescribed medications to achieve blood glucose in normal ranges: Fasting glucose 65-99 mg/dL    Expected Outcomes Short Term: Participant verbalizes understanding of the signs/symptoms and immediate care of hyper/hypoglycemia, proper foot care and importance of medication, aerobic/resistive exercise and nutrition plan for blood glucose control.;Long Term: Attainment of HbA1C < 7%.    Hypertension Yes    Intervention Provide education on lifestyle modifcations including regular physical activity/exercise, weight management, moderate sodium restriction and increased consumption of fresh fruit, vegetables, and low fat dairy, alcohol moderation, and smoking cessation.;Monitor prescription use compliance.     Expected Outcomes Short Term: Continued assessment and intervention until BP is < 140/14m HG in hypertensive participants. < 130/835mHG in hypertensive participants with diabetes, heart failure or chronic kidney disease.;Long Term: Maintenance of blood pressure at goal levels.    Lipids Yes    Intervention Provide education and support for participant on nutrition & aerobic/resistive exercise along with prescribed medications to achieve LDL '70mg'$ , HDL >'40mg'$ .    Expected Outcomes Short Term: Participant states understanding of desired cholesterol values and is compliant with medications prescribed. Participant is following exercise prescription and nutrition guidelines.;Long Term: Cholesterol controlled with medications as prescribed, with individualized exercise RX and with personalized nutrition plan. Value goals: LDL < '70mg'$ , HDL > 40 mg.    Personal Goal Other Yes    Personal Goal Start exercise program. Know how much she can lift. Decrease risk for a heart attack.    Intervention Provide education on heart healthy eating, healthy mindset, and individualized exercise program including aerobic, resistance, and stretching routine  to decrease risk for future cardiac event.    Expected Outcomes Patient will understand parameters for exercise and safe lifting. Patient will make healthy lifestyle changes to decrease risk for future cardiac event/heart attack.             Core Components/Risk Factors/Patient Goals Review:    Core Components/Risk Factors/Patient Goals at Discharge (Final Review):    ITP Comments:  ITP Comments     Row Name 03/24/22 1058 04/01/22 1414         ITP Comments Medical Director- Dr. Fransico Him, MD, Introduction to Pritikin Education Program/ Intensive Cardiac Rehab Orientatin Packet reviewed with the patient 30 Day ITP Review. Yarimar is to start intensive cardiac rehab today.               Comments: See ITP comments.Harrell Gave RN BSN

## 2022-04-01 NOTE — Progress Notes (Signed)
Daily Session Note  Patient Details  Name: Victoria Ferguson MRN: [id number] Date of Birth: 1940-06-07 Referring Provider:   Flowsheet Row INTENSIVE CARDIAC REHAB ORIENT from 03/24/2022 in [facility] [place]  Referring Provider Lelon Perla, MD       Encounter Date: 04/01/2022  Check In:  Session Check In - 04/01/22 1507       Check-In   Supervising physician immediately available to respond to emergencies [facility] - Physician supervision    Physician(s) Dr. Burt Knack    Location [facility]    Staff Present Barnet Pall, RN, Deland Pretty, MS, ACSM-CEP, Exercise Physiologist;Johnny Starleen Blue, MS, Exercise Physiologist;Samantha [place], RD, Idalia Needle, MS, Exercise Physiologist;Jetta Gilford Rile BS, ACSM-CEP, Exercise Physiologist    Virtual Visit No    Medication changes reported     No    Fall or balance concerns reported    No    Tobacco Cessation No Change    Warm-up and Cool-down Performed as group-led instruction    Resistance Training Performed No    VAD Patient? No    PAD/SET Patient? No      Pain Assessment   Currently in Pain? No/denies    Pain Score 0-No pain    Multiple Pain Sites No             Capillary Blood Glucose: Results for orders placed or performed during the hospital encounter of 04/01/22 (from the past 24 hour(s))  Glucose, capillary     Status: Abnormal   Collection Time: 04/01/22  3:34 PM  Result Value Ref Range   Glucose-Capillary 156 (H) 70 - 99 mg/dL     Exercise Prescription Changes - 04/01/22 1600       Response to Exercise   Blood Pressure (Admit) 102/82    Blood Pressure (Exercise) 112/82    Blood Pressure (Exit) 100/58    Heart Rate (Exercise) 80 bpm    Heart Rate (Exit) 64 bpm    Rating of Perceived Exertion (Exercise) 11    Perceived Dyspnea (Exercise) 0    Symptoms none    Comments Pt first day of CRP2 program    Duration Progress to 30 minutes of   aerobic without signs/symptoms of physical distress    Intensity THRR unchanged      Progression   Progression Continue to progress workloads to maintain intensity without signs/symptoms of physical distress.    Average METs 1.7      Resistance Training   Training Prescription Yes    Weight 1 lb    Reps 10-15    Time 10 Minutes      Interval Training   Interval Training No      NuStep   Level 1    SPM 54    Minutes 27    METs 1.7             Social History   Tobacco Use  Smoking Status Former   Types: Cigarettes  Smokeless Tobacco Not on file    Goals Met:  Exercise tolerated well No report of concerns or symptoms today Strength training completed today  Goals Unmet:  Not Applicable  Comments: Pt started cardiac rehab today.  Pt tolerated light exercise without difficulty. VSS, telemetry-Sinus Rhythm, asymptomatic.  Medication list reconciled. Pt denies barriers to medicaiton compliance.  PSYCHOSOCIAL ASSESSMENT:  PHQ-0. Pt exhibits positive coping skills, hopeful outlook with supportive family. No psychosocial needs identified at this time, no psychosocial interventions  necessary.    Pt enjoys reading and spending time on the computer.   Pt oriented to exercise equipment and routine.    Understanding verbalized. Harrell Gave RN BSN    Dr. Fransico Him is Medical Director for Cardiac Rehab at [facility].

## 2022-04-03 ENCOUNTER — Encounter (HOSPITAL_COMMUNITY)
Admission: RE | Admit: 2022-04-03 | Discharge: 2022-04-03 | Disposition: A | Discharge status: Home / Self Care | Payer: Medicare Other | Source: Ambulatory Visit | Attending: Cardiology | Admitting: Cardiology

## 2022-04-03 DIAGNOSIS — I2089 Other forms of angina pectoris: Secondary | ICD-10-CM

## 2022-04-03 LAB — GLUCOSE, CAPILLARY
Glucose-Capillary: 128 mg/dL — ABNORMAL HIGH (ref 70–99)
Glucose-Capillary: 53 mg/dL — ABNORMAL LOW (ref 70–99)

## 2022-04-03 NOTE — Progress Notes (Signed)
Lou in today for cardiac rehab.  Pt attended education class.  Prior to exercise CBG checked 53.  Pt asymptomatic and was quite surprised.  Asked what her CBG was at home she reported that it was 78 this morning and 72 yesterday morning.  Reviewed medications,takes Glipizide '5mg'$  daily around lunch time.  Pt had ham biscuit and oatmeal this morning and two chicken wing portions prior to exercise.  Conferred with RD.  Information given for pt as guide of great food options prior to exercise as well as general parameters of food choices based upon her home cbg readings.  Pt given 30 grams of carbohydrate - gingerale and graham crackers.  Due to low readings at home , ALC 6.2 and along with advanced age felt pt should be seen by PCP for evaluation.  Called office and message left for nurse and appt scheduled for Tuesday afternoon.  Pt felt her daughter could bring her.  Wrote down CBG readings for here and asked pt to please keep a reading log of her CBG readings at home to bring to her follow up appt.  Verbalized understanding.  Rechecked cbg 128.  Advised pt that although her CBG is 128 now she will need to add protein so that her blood glucose will remain stable.  Pt discharged home with no further complaints. Cherre Huger, BSN Cardiac and Training and development officer

## 2022-04-06 ENCOUNTER — Encounter (HOSPITAL_COMMUNITY): Payer: Medicare Other

## 2022-04-07 DIAGNOSIS — Z23 Encounter for immunization: Secondary | ICD-10-CM | POA: Diagnosis not present

## 2022-04-07 DIAGNOSIS — E162 Hypoglycemia, unspecified: Secondary | ICD-10-CM | POA: Diagnosis not present

## 2022-04-08 ENCOUNTER — Encounter (HOSPITAL_COMMUNITY): Payer: Medicare Other

## 2022-04-10 ENCOUNTER — Encounter (HOSPITAL_COMMUNITY)
Admission: RE | Admit: 2022-04-10 | Discharge: 2022-04-10 | Disposition: A | Discharge status: Home / Self Care | Payer: Medicare Other | Source: Ambulatory Visit | Attending: Cardiology | Admitting: Cardiology

## 2022-04-10 DIAGNOSIS — Z48812 Encounter for surgical aftercare following surgery on the circulatory system: Secondary | ICD-10-CM | POA: Diagnosis not present

## 2022-04-10 DIAGNOSIS — I2089 Other forms of angina pectoris: Secondary | ICD-10-CM | POA: Diagnosis not present

## 2022-04-10 DIAGNOSIS — Z9861 Coronary angioplasty status: Secondary | ICD-10-CM | POA: Diagnosis not present

## 2022-04-10 DIAGNOSIS — I10 Essential (primary) hypertension: Secondary | ICD-10-CM | POA: Diagnosis not present

## 2022-04-10 LAB — GLUCOSE, CAPILLARY
Glucose-Capillary: 137 mg/dL — ABNORMAL HIGH (ref 70–99)
Glucose-Capillary: 167 mg/dL — ABNORMAL HIGH (ref 70–99)

## 2022-04-13 ENCOUNTER — Encounter (HOSPITAL_COMMUNITY): Payer: Medicare Other

## 2022-04-15 ENCOUNTER — Encounter (HOSPITAL_COMMUNITY)
Admission: RE | Admit: 2022-04-15 | Discharge: 2022-04-15 | Disposition: A | Discharge status: Home / Self Care | Payer: Medicare Other | Source: Ambulatory Visit | Attending: Cardiology | Admitting: Cardiology

## 2022-04-15 DIAGNOSIS — Z9861 Coronary angioplasty status: Secondary | ICD-10-CM | POA: Diagnosis not present

## 2022-04-15 DIAGNOSIS — I10 Essential (primary) hypertension: Secondary | ICD-10-CM | POA: Diagnosis not present

## 2022-04-15 DIAGNOSIS — I2089 Other forms of angina pectoris: Secondary | ICD-10-CM | POA: Diagnosis not present

## 2022-04-15 DIAGNOSIS — Z48812 Encounter for surgical aftercare following surgery on the circulatory system: Secondary | ICD-10-CM | POA: Diagnosis not present

## 2022-04-16 LAB — GLUCOSE, CAPILLARY: Glucose-Capillary: 120 mg/dL — ABNORMAL HIGH (ref 70–99)

## 2022-04-17 ENCOUNTER — Encounter (HOSPITAL_COMMUNITY)
Admission: RE | Admit: 2022-04-17 | Discharge: 2022-04-17 | Disposition: A | Discharge status: Home / Self Care | Payer: Medicare Other | Source: Ambulatory Visit | Attending: Cardiology | Admitting: Cardiology

## 2022-04-17 DIAGNOSIS — Z9861 Coronary angioplasty status: Secondary | ICD-10-CM | POA: Diagnosis not present

## 2022-04-17 DIAGNOSIS — I2089 Other forms of angina pectoris: Secondary | ICD-10-CM | POA: Diagnosis not present

## 2022-04-17 DIAGNOSIS — I10 Essential (primary) hypertension: Secondary | ICD-10-CM | POA: Diagnosis not present

## 2022-04-17 DIAGNOSIS — Z48812 Encounter for surgical aftercare following surgery on the circulatory system: Secondary | ICD-10-CM | POA: Diagnosis not present

## 2022-04-22 ENCOUNTER — Encounter (HOSPITAL_COMMUNITY)
Admission: RE | Admit: 2022-04-22 | Discharge: 2022-04-22 | Disposition: A | Discharge status: Home / Self Care | Payer: Medicare Other | Source: Ambulatory Visit | Attending: Cardiology | Admitting: Cardiology

## 2022-04-22 DIAGNOSIS — I2089 Other forms of angina pectoris: Secondary | ICD-10-CM

## 2022-04-22 DIAGNOSIS — Z9861 Coronary angioplasty status: Secondary | ICD-10-CM | POA: Diagnosis not present

## 2022-04-22 DIAGNOSIS — Z48812 Encounter for surgical aftercare following surgery on the circulatory system: Secondary | ICD-10-CM | POA: Diagnosis not present

## 2022-04-22 DIAGNOSIS — I10 Essential (primary) hypertension: Secondary | ICD-10-CM | POA: Diagnosis not present

## 2022-04-24 ENCOUNTER — Encounter (HOSPITAL_COMMUNITY)
Admission: RE | Admit: 2022-04-24 | Discharge: 2022-04-24 | Disposition: A | Discharge status: Home / Self Care | Payer: Medicare Other | Source: Ambulatory Visit | Attending: Cardiology | Admitting: Cardiology

## 2022-04-24 DIAGNOSIS — I10 Essential (primary) hypertension: Secondary | ICD-10-CM | POA: Diagnosis not present

## 2022-04-24 DIAGNOSIS — Z9861 Coronary angioplasty status: Secondary | ICD-10-CM | POA: Diagnosis not present

## 2022-04-24 DIAGNOSIS — I2089 Other forms of angina pectoris: Secondary | ICD-10-CM | POA: Diagnosis not present

## 2022-04-24 DIAGNOSIS — Z48812 Encounter for surgical aftercare following surgery on the circulatory system: Secondary | ICD-10-CM | POA: Diagnosis not present

## 2022-04-28 NOTE — Progress Notes (Signed)
Cardiac Individual Treatment Plan  Patient Details  Name: Victoria Ferguson MRN: [id number] Date of Birth: [birth year] Referring Provider:   Flowsheet Row INTENSIVE CARDIAC REHAB ORIENT from 03/24/2022 in [facility] V[facility]  Referring Provider Lelon Perla, MD       Initial Encounter Date:  [place] from 03/24/2022 in [facility] for Heart, Vascular, & Lung Health  Date 03/24/22       Visit Diagnosis: Chronic stable angina  Patient's Home Medications on Admission:  Current Outpatient Medications:    aspirin EC 81 MG tablet, Take 1 tablet (81 mg total) by mouth daily. Swallow whole. (Patient taking differently: Take 81 mg by mouth at bedtime. Swallow whole.), Disp: 90 tablet, Rfl: 3   ergocalciferol (VITAMIN D2) 1.25 MG (50000 UT) capsule, Take 50,000 Units by mouth every Monday. WITH LUNCH, Disp: , Rfl:    ezetimibe (ZETIA) 10 MG tablet, Take 1 tablet (10 mg total) by mouth daily. (Patient taking differently: Take 10 mg by mouth daily with lunch.), Disp: 90 tablet, Rfl: 3   ferrous sulfate 325 (65 FE) MG tablet, Take 325 mg by mouth every 3 (three) days., Disp: , Rfl:    FREESTYLE LITE test strip, , Disp: , Rfl:    glipiZIDE (GLUCOTROL) 5 MG tablet, Take 5 mg by mouth daily with lunch., Disp: , Rfl:    hydrochlorothiazide (HYDRODIURIL) 25 MG tablet, Take 25 mg by mouth daily with lunch., Disp: , Rfl:    levothyroxine (SYNTHROID) 75 MCG tablet, Take 75 mcg by mouth daily., Disp: , Rfl:    losartan (COZAAR) 50 MG tablet, Take 50 mg by mouth daily with lunch., Disp: , Rfl:    metoprolol succinate (TOPROL XL) 25 MG 24 hr tablet, Take 1 tablet (25 mg total) by mouth daily. (Patient taking differently: Take 25 mg by mouth at bedtime.), Disp: 90 tablet, Rfl: 3   nitroGLYCERIN (NITROSTAT) 0.4 MG SL tablet, Place 1 tablet (0.4 mg total) under the tongue every 5 (five) minutes as  needed for chest pain., Disp: 25 tablet, Rfl: 3   potassium chloride (KLOR-CON M) 10 MEQ tablet, Take 10 mEq by mouth daily with lunch., Disp: , Rfl:    rosuvastatin (CRESTOR) 40 MG tablet, Take 1 tablet (40 mg total) by mouth daily., Disp: 90 tablet, Rfl: 3  Past Medical History: Past Medical History:  Diagnosis Date   Diabetes mellitus    Hyperlipemia    Hypertension    Hypothyroidism    Lung nodule     Tobacco Use: Social History   Tobacco Use  Smoking Status Former   Types: Cigarettes  Smokeless Tobacco Not on file    Labs: Review Flowsheet       Latest Ref Rng & Units 07/07/2021 09/24/2021  Labs for ITP Cardiac and Pulmonary Rehab  Cholestrol 100 - 199 mg/dL 159  116   LDL (calc) 0 - 99 mg/dL 72  41   Direct LDL 0 - 99 mg/dL - 39   HDL-C >39 mg/dL 60  49   Trlycerides 0 - 149 mg/dL 157  156     Capillary Blood Glucose: Lab Results  Component Value Date   GLUCAP 120 (H) 04/15/2022   GLUCAP 167 (H) 04/10/2022   GLUCAP 137 (H) 04/10/2022   GLUCAP 128 (H) 04/03/2022   GLUCAP 53 (L) 04/03/2022     Exercise Target Goals: Exercise Program Goal: Individual exercise prescription set using  results from initial 6 min walk test and THRR while considering  patient's activity barriers and safety.   Exercise Prescription Goal: Initial exercise prescription builds to 30-45 minutes a day of aerobic activity, 2-3 days per week.  Home exercise guidelines will be given to patient during program as part of exercise prescription that the participant will acknowledge.  Activity Barriers & Risk Stratification:  Activity Barriers & Cardiac Risk Stratification - 03/24/22 1209       Activity Barriers & Cardiac Risk Stratification   Activity Barriers Other (comment)    Comments Chronic knee pain, occassional bilateral ankle edema, and bilateral leg pain in the mornings.    Cardiac Risk Stratification High             6 Minute Walk:  6 Minute Walk     Row Name 03/24/22  1154         6 Minute Walk   Phase Initial     Distance 863 feet     Walk Time 6 minutes     # of Rest Breaks 1  Seated rest break at 3 mins 8 secs, restart at 4 mins 11 secs.     MPH 1.63     METS 1.23     RPE 11     Perceived Dyspnea  0     VO2 Peak 4.31     Symptoms Yes (comment)     Comments Patient c/o feeling lightheaded, which she thinks was related to wearing her reading glasses, resolved with rest and removal of readers. Seated rest break taken due to fatigue.     Resting HR 56 bpm     Resting BP 108/72     Resting Oxygen Saturation  97 %     Exercise Oxygen Saturation  during 6 min walk 99 %     Max Ex. HR 86 bpm     Max Ex. BP 138/60     2 Minute Post BP 112/64              Oxygen Initial Assessment:   Oxygen Re-Evaluation:   Oxygen Discharge (Final Oxygen Re-Evaluation):   Initial Exercise Prescription:  Initial Exercise Prescription - 03/24/22 1300       Date of Initial Exercise RX and Referring Provider   Date 03/24/22    Referring Provider Lelon Perla, MD    Expected Discharge Date 05/22/22      NuStep   Level 1    SPM 75    Minutes 25    METs 1.5      Prescription Details   Frequency (times per week) 2    Duration Progress to 30 minutes of continuous aerobic without signs/symptoms of physical distress      Intensity   THRR 40-80% of Max Heartrate 62-125    Ratings of Perceived Exertion 11-13    Perceived Dyspnea 0-4      Progression   Progression Continue to progress workloads to maintain intensity without signs/symptoms of physical distress.      Resistance Training   Training Prescription Yes    Weight 1 lb    Reps 10-15             Perform Capillary Blood Glucose checks as needed.  Exercise Prescription Changes:   Exercise Prescription Changes     Row Name 04/01/22 1600 04/17/22 1620           Response to Exercise   Blood Pressure (Admit) 102/82 102/70  Blood Pressure (Exercise) 112/82 138/70       Blood Pressure (Exit) 100/58 104/72      Heart Rate (Admit) -- 66 bpm      Heart Rate (Exercise) 80 bpm 103 bpm      Heart Rate (Exit) 64 bpm 74 bpm      Rating of Perceived Exertion (Exercise) 11 11      Perceived Dyspnea (Exercise) 0 0      Symptoms none none      Comments Pt first day of CRP2 program Reviewed MET's, goals and home ExRx      Duration Progress to 30 minutes of  aerobic without signs/symptoms of physical distress Progress to 30 minutes of  aerobic without signs/symptoms of physical distress      Intensity THRR unchanged THRR unchanged        Progression   Progression Continue to progress workloads to maintain intensity without signs/symptoms of physical distress. Continue to progress workloads to maintain intensity without signs/symptoms of physical distress.      Average METs 1.7 2        Resistance Training   Training Prescription Yes Yes      Weight 1 lb 1 lb      Reps 10-15 10-15      Time 10 Minutes 10 Minutes        Interval Training   Interval Training No No        NuStep   Level 1 1      SPM 54 66      Minutes 27 27      METs 1.7 2        Home Exercise Plan   Plans to continue exercise at -- Home (comment)      Frequency -- Add 2 additional days to program exercise sessions.      Initial Home Exercises Provided -- 04/17/22               Exercise Comments:   Exercise Comments     Row Name 04/01/22 1551 04/17/22 1629         Exercise Comments Pt first day in the CRP2 program. Pt tolerated exercise well with an average MET level of 1.7. Pt is learning her THRR, RPE and ExRx. Pt does have some leg weakness, but we will work on gaining strength. Will continue to monitor pt and progress workloads as tolerated without sign or symptom. Reviewed MET's, goals and home ExRx today with pt. Pt tolerated exercise well with an average MET level of 2.0. Pt is feeling good about her goals, she now knows her limits to exercise and is gaining strength and MET's.  Encouraged pt that adding exercise at home is a good way to increase strength and gain more heart strength. Pt will add in walking, and physical therapy exercise for her arms and legs that she enjoys doing for 1-2 days for 15-30 mins as tolerated with fatigue and with caring for her husband. Encouraged that as little as 10 min bouts of exercise could help so get in what she can.               Exercise Goals and Review:   Exercise Goals     Row Name 03/24/22 1209             Exercise Goals   Increase Physical Activity Yes       Intervention Provide advice, education, support and counseling about physical activity/exercise needs.;Develop an individualized exercise prescription  for aerobic and resistive training based on initial evaluation findings, risk stratification, comorbidities and participant's personal goals.       Expected Outcomes Short Term: Attend rehab on a regular basis to increase amount of physical activity.;Long Term: Add in home exercise to make exercise part of routine and to increase amount of physical activity.;Long Term: Exercising regularly at least 3-5 days a week.       Increase Strength and Stamina Yes       Intervention Provide advice, education, support and counseling about physical activity/exercise needs.;Develop an individualized exercise prescription for aerobic and resistive training based on initial evaluation findings, risk stratification, comorbidities and participant's personal goals.       Expected Outcomes Short Term: Increase workloads from initial exercise prescription for resistance, speed, and METs.;Short Term: Perform resistance training exercises routinely during rehab and add in resistance training at home;Long Term: Improve cardiorespiratory fitness, muscular endurance and strength as measured by increased METs and functional capacity (6MWT)       Able to understand and use rate of perceived exertion (RPE) scale Yes       Intervention Provide  education and explanation on how to use RPE scale       Expected Outcomes Short Term: Able to use RPE daily in rehab to express subjective intensity level;Long Term:  Able to use RPE to guide intensity level when exercising independently       Knowledge and understanding of Target Heart Rate Range (THRR) Yes       Intervention Provide education and explanation of THRR including how the numbers were predicted and where they are located for reference       Expected Outcomes Short Term: Able to state/look up THRR;Long Term: Able to use THRR to govern intensity when exercising independently;Short Term: Able to use daily as guideline for intensity in rehab       Able to check pulse independently Yes       Intervention Provide education and demonstration on how to check pulse in carotid and radial arteries.;Review the importance of being able to check your own pulse for safety during independent exercise       Expected Outcomes Long Term: Able to check pulse independently and accurately;Short Term: Able to explain why pulse checking is important during independent exercise       Understanding of Exercise Prescription Yes       Intervention Provide education, explanation, and written materials on patient's individual exercise prescription       Expected Outcomes Short Term: Able to explain program exercise prescription;Long Term: Able to explain home exercise prescription to exercise independently                Exercise Goals Re-Evaluation :  Exercise Goals Re-Evaluation     Row Name 04/01/22 1546 04/17/22 1622           Exercise Goal Re-Evaluation   Exercise Goals Review Increase Physical Activity;Increase Strength and Stamina;Able to understand and use rate of perceived exertion (RPE) scale;Knowledge and understanding of Target Heart Rate Range (THRR);Understanding of Exercise Prescription Increase Physical Activity;Increase Strength and Stamina;Able to understand and use rate of perceived  exertion (RPE) scale;Knowledge and understanding of Target Heart Rate Range (THRR);Understanding of Exercise Prescription      Comments Pt first day in the CRP2 program. Pt tolerated exercise well with an average MET level of 1.7. Pt is learning her THRR, RPE and ExRx. Pt does have some leg weakness, but we will work on gaining  strength Reviewed MET's, goals and home ExRx today with pt. Pt tolerated exercise well with an average MET level of 2.0. Pt is feeling good about her goals, she now knows her limits to exercise and is gaining strength and MET's. Encouraged pt that adding exercise at home is a good way to increase strength and gain more heart strength. Pt will add in walking, and physical therapy exercise for her arms and legs that she enjoys doing for 1-2 days for 15-30 mins as tolerated with fatigue and with caring for her husband. Encouraged that as little as 10 min bouts of exercise could help so get in what she can.      Expected Outcomes Will continue to monitor pt and progress workloads as tolerated without sign or symptom. Pt will continue to exercise on her own and gain strength. Will continue to monitor pt and progress workloads as tolerated without sign or symptom.               Discharge Exercise Prescription (Final Exercise Prescription Changes):  Exercise Prescription Changes - 04/17/22 1620       Response to Exercise   Blood Pressure (Admit) 102/70    Blood Pressure (Exercise) 138/70    Blood Pressure (Exit) 104/72    Heart Rate (Admit) 66 bpm    Heart Rate (Exercise) 103 bpm    Heart Rate (Exit) 74 bpm    Rating of Perceived Exertion (Exercise) 11    Perceived Dyspnea (Exercise) 0    Symptoms none    Comments Reviewed MET's, goals and home ExRx    Duration Progress to 30 minutes of  aerobic without signs/symptoms of physical distress    Intensity THRR unchanged      Progression   Progression Continue to progress workloads to maintain intensity without signs/symptoms  of physical distress.    Average METs 2      Resistance Training   Training Prescription Yes    Weight 1 lb    Reps 10-15    Time 10 Minutes      Interval Training   Interval Training No      NuStep   Level 1    SPM 66    Minutes 27    METs 2      Home Exercise Plan   Plans to continue exercise at Home (comment)    Frequency Add 2 additional days to program exercise sessions.    Initial Home Exercises Provided 04/17/22             Nutrition:  Target Goals: Understanding of nutrition guidelines, daily intake of sodium <1575m, cholesterol <2071m calories 30% from fat and 7% or less from saturated fats, daily to have 5 or more servings of fruits and vegetables.  Biometrics:  Pre Biometrics - 03/24/22 1058       Pre Biometrics   Waist Circumference 36.75 inches    Hip Circumference 44.25 inches    Waist to Hip Ratio 0.83 %    Triceps Skinfold 34 mm    % Body Fat 43.7 %    Grip Strength 12 kg    Flexibility 11 in    Single Leg Stand 7.18 seconds              Nutrition Therapy Plan and Nutrition Goals:  Nutrition Therapy & Goals - 04/02/22 0848       Nutrition Therapy   Diet Heart Healthy Diet    Drug/Food Interactions Statins/Certain Fruits      Personal  Nutrition Goals   Nutrition Goal Patient to identify strategies for managing cardiovascular risk by attending the weekly Prit[employer]in education and nutrition series    Personal Goal #2 Patient to identify food sources and limit daily intake of saturated fats, trans fat, sodium, and refined carbohydrates    Personal Goal #3 Patient to reduce sodium intake to <1569m per day    Personal Goal #4 Patient to improve diet quality by using the plate method as a daily guide for meal planning to include lean protein/plant protein, fruits, vegetables, whole grains, and nonfat dairy    Comments 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 She reports take-out/fast food multiple times per week and PYP reflects over  consuming saturated fats. LYork Cerisewill benefit from adhereance to the Pritikin eating plan to support LDL <70, A1c <7.0%, and improved diet quality. LYork Cerisedoes report her hearing is a barrier to learning but she is in the process of getting hearing aids per notes 02/18/2022.      Intervention Plan   Intervention Prescribe, educate and counsel regarding individualized specific dietary modifications aiming towards targeted core components such as weight, hypertension, lipid management, diabetes, heart failure and other comorbidities.;Nutrition handout(s) given to patient.    Expected Outcomes Short Term Goal: Understand basic principles of dietary content, such as calories, fat, sodium, cholesterol and nutrients.;Long Term Goal: Adherence to prescribed nutrition plan.             Nutrition Assessments:  MEDIFICTS Score Key: ?70 Need to make dietary changes  40-70 Heart Healthy Diet ? 40 Therapeutic Level Cholesterol Diet    Picture Your Plate Scores: <<11Unhealthy dietary pattern with much room for improvement. 41-50 Dietary pattern unlikely to meet recommendations for good health and room for improvement. 51-60 More healthful dietary pattern, with some room for improvement.  >60 Healthy dietary pattern, although there may be some specific behaviors that could be improved.    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 She reports take-out/fast food multiple times per week and PYP reflects over consuming saturated fats. LYork Cerisewill benefit from adhereance to the Pritikin eating plan to support LDL <70, A1c <7.0%, and improved diet quality. LYork Cerisedoes report her hearing is a barrier to learning but she is in the process of getting hearing aids per notes 02/18/2022.                 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 She reports take-out/fast food multiple times per week and PYP reflects over consuming saturated fats. LYork Cerisewill benefit from adhereance to the Pritikin eating plan to support LDL <70, A1c <7.0%, and improved diet quality. LYork Cerisedoes report her hearing is a barrier to learning but she is in the process of getting hearing aids per notes 02/18/2022.  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 She reports take-out/fast food multiple times per week and PYP reflects over consuming saturated fats. York Ferguson will benefit from adhereance to the Pritikin eating plan to support LDL <70, A1c <7.0%, and improved diet quality. York Ferguson does report her hearing is a barrier to learning but she is in the process of getting hearing aids per notes 02/18/2022.             Psychosocial: Target Goals: Acknowledge presence or absence of significant depression and/or stress, maximize coping skills, provide positive support system. Participant is able to verbalize types and ability to use techniques and skills needed for reducing stress and depression.  Initial Review & Psychosocial Screening:  Initial Psych Review & Screening - 03/24/22 1218       Initial Review   Current issues with Current Stress Concerns;History of Depression    Source of Stress Concerns Family;Unable to participate in former interests or hobbies;Unable to perform yard/household activities    Comments York Ferguson denies being  depressed currenlty but says she has felt depressed at times as her husband has poor mobility one of Lous son's has dementia.      Family Dynamics   Good Support System? Yes   Lous has her husband and children for support     Barriers   Psychosocial barriers to participate in program The patient should benefit from training in stress management and relaxation.      Screening Interventions   Interventions Encouraged to exercise;To provide support and resources with identified psychosocial needs    Expected Outcomes Long Term Goal: Stressors or current issues are controlled or eliminated.             Quality of Life Scores:  Quality of Life - 03/24/22 1254       Quality of Life   Select Quality of Life      Quality of Life Scores   Health/Function Pre 25.2 %    Socioeconomic Pre 24.43 %    Psych/Spiritual Pre 20.57 %    Family Pre 24 %    GLOBAL Pre 23.91 %            Scores of 19 and below usually indicate a poorer quality of life in these areas.  A difference of  2-3 points is a clinically meaningful difference.  A difference of 2-3 points in the total score of the Quality of Life Index has been associated with significant improvement in overall quality of life, self-image, physical symptoms, and general health in studies assessing change in quality of life.  PHQ-9: Review Flowsheet       03/24/2022  Depression screen PHQ 2/9  Decreased Interest 0  Down, Depressed, Hopeless 0  PHQ - 2 Score 0   Interpretation of Total Score  Total Score Depression Severity:  1-4 = Minimal depression, 5-9 = Mild depression, 10-14 = Moderate depression, 15-19 = Moderately severe depression, 20-27 = Severe depression   Psychosocial Evaluation and Intervention:   Psychosocial Re-Evaluation:  Psychosocial Re-Evaluation     [place] Name 04/01/22 1606 04/28/22 1519           Psychosocial Re-Evaluation   Current issues with Current Stress Concerns Current Stress Concerns  Comments Victoria cares for her husband who has health issues York Ferguson cares for her husband who has health issues. York Ferguson mentins that she worries about her husband who has limited mobility      Interventions Encouraged to attend Cardiac Rehabilitation for the exercise Encouraged to attend Cardiac Rehabilitation for the exercise      Continue Psychosocial Services  No Follow up required No Follow up required        Initial Review   Source of Stress Concerns Family Family      Comments will continue to monitor and offer support as needed will continue to monitor and offer support as needed               Psychosocial Discharge (Final Psychosocial Re-Evaluation):  Psychosocial Re-Evaluation - 04/28/22 1519       Psychosocial Re-Evaluation   Current issues with Current Stress Concerns    Comments York Ferguson cares for her husband who has health issues. York Ferguson mentins that she worries about her husband who has limited mobility    Interventions Encouraged to attend Cardiac Rehabilitation for the exercise    Continue Psychosocial Services  No Follow up required      Initial Review   Source of Stress Concerns Family    Comments will continue to monitor and offer support as needed             Vocational Rehabilitation: Provide vocational rehab assistance to qualifying candidates.   Vocational Rehab Evaluation & Intervention:  Vocational Rehab - 03/24/22 1221       Initial Vocational Rehab Evaluation & Intervention   Assessment shows need for Vocational Rehabilitation No   York Ferguson is reitred and does not need vocational rehab at this time            Education: Education Goals: Education classes will be provided on a weekly basis, covering required topics. Participant will state understanding/return demonstration of topics presented.    Education     Row Name 04/01/22 1600     Education   Cardiac Education Topics [employer]   Educator Dietitian    Weekly Topic One-Pot Wonders   Instruction Review Code 1- Verbalizes Understanding   Class Start Time 1353   Class Stop Time 1437   Class Time Calculation (min) 44 min    [place] Name 04/03/22 1500     Education   Cardiac Education Topics [employer]ikin   Lexicographer Nutrition   Nutrition Dining Out - Part 1   Instruction Review Code 1- Verbalizes Understanding   Class Start Time 1355   Class Stop Time 1430   Class Time Calculation (min) 35 min    [place] Name 04/10/22 1500     Education   Cardiac Education Topics [employer]   Charity fundraiser Exercise Physiologist   Select Psychosocial   Psychosocial How Our Thoughts Can Heal Our Hearts   Instruction Review Code 1- Verbalizes Understanding   Class Start Time 3532   Class Stop Time 1440   Class Time Calculation (min) 43 min    [place] Name 04/15/22 1500     Education   Cardiac Education Topics Pritikin   Financial trader   Weekly Topic [employer]- Spotlight on the [facility] Zones   Instruction Review  Code 1- Verbalizes Understanding   Class Start Time 1355   Class Stop Time 1435   Class Time Calculation (min) 40 min    [place] Name 04/17/22 1500     Education   Cardiac Education Topics Pritikin   [employer] Workshops     Workshops   Educator Exercise Physiologist   [facility] Psychosocial   Psychosocial Workshop Recognizing and Reducing Stress   Instruction Review Code 1- Verbalizes Understanding   Class Start Time 1355   Class Stop Time 1445   Class Time Calculation (min) 50 min    [place] Name 04/22/22 1600     Education   Cardiac Education Topics Pritikin   Charity fundraiser Exercise Physiologist   Select Nutrition   Nutrition Cooking - Healthy Salads and Dressing   Instruction Review Code 1- Verbalizes Understanding   Class Start Time 1358   Class Stop Time 1442    Class Time Calculation (min) 44 min    [place] Name 04/24/22 1500     Education   Cardiac Education Topics Pritikin   Architect Education   General Education Heart Disease Risk Reduction   Instruction Review Code 1- Verbalizes Understanding   Class Start Time 1400   Class Stop Time 1440   Class Time Calculation (min) 40 min            Core Videos: Exercise    Move It!  Clinical staff conducted group or individual video education with verbal and written material and guidebook.  Patient learns the recommended Pritikin exercise program. Exercise with the goal of living a long, healthy life. Some of the health benefits of exercise include controlled diabetes, healthier blood pressure levels, improved cholesterol levels, improved heart and lung capacity, improved sleep, and better body composition. Everyone should speak with their doctor before starting or changing an exercise routine.  Biomechanical Limitations Clinical staff conducted group or individual video education with verbal and written material and guidebook.  Patient learns how biomechanical limitations can impact exercise and how we can mitigate and possibly overcome limitations to have an impactful and balanced exercise routine.  Body Composition Clinical staff conducted group or individual video education with verbal and written material and guidebook.  Patient learns that body composition (ratio of muscle mass to fat mass) is a key component to assessing overall fitness, rather than body weight alone. Increased fat mass, especially visceral belly fat, can put [place] at increased risk for metabolic syndrome, type 2 diabetes, heart disease, and even death. It is recommended to combine diet and exercise (cardiovascular and resistance training) to improve your body composition. Seek guidance from your physician and exercise physiologist before implementing  an exercise routine.  Exercise Action Plan Clinical staff conducted group or individual video education with verbal and written material and guidebook.  Patient learns the recommended strategies to achieve and enjoy long-term exercise adherence, including variety, self-motivation, self-efficacy, and positive decision making. Benefits of exercise include fitness, good health, weight management, more energy, better sleep, less stress, and overall well-being.  Medical   Heart Disease Risk Reduction Clinical staff conducted group or individual video education with verbal and written material and guidebook.  Patient learns our heart is our most vital organ as it circulates oxygen, nutrients, white blood cells, and hormones throughout the entire body, and carries waste away. Data supports a plant-based eating plan like  the Pritikin Program for its effectiveness in slowing progression of and reversing heart disease. The video provides a number of recommendations to address heart disease.   Metabolic Syndrome and Belly Fat  Clinical staff conducted group or individual video education with verbal and written material and guidebook.  Patient learns what metabolic syndrome is, how it leads to heart disease, and how one can reverse it and keep it from coming back. You have metabolic syndrome if you have 3 of the following 5 criteria: abdominal obesity, high blood pressure, high triglycerides, low HDL cholesterol, and high blood sugar.  Hypertension and Heart Disease Clinical staff conducted group or individual video education with verbal and written material and guidebook.  Patient learns that high blood pressure, or hypertension, is very common in the [place]. Hypertension is largely due to excessive salt intake, but other important risk factors include being overweight, physical inactivity, drinking too much alcohol, smoking, and not eating enough potassium from fruits and vegetables. High blood  pressure is a leading risk factor for heart attack, stroke, congestive heart failure, dementia, kidney failure, and premature death. Long-term effects of excessive salt intake include stiffening of the arteries and thickening of heart muscle and organ damage. Recommendations include ways to reduce hypertension and the risk of heart disease.  Diseases of Our Time - Focusing on Diabetes Clinical staff conducted group or individual video education with verbal and written material and guidebook.  Patient learns why the best way to stop diseases of our time is prevention, through food and other lifestyle changes. Medicine (such as prescription pills and surgeries) is often only a Band-Aid on the problem, not a long-term solution. Most common diseases of our time include obesity, type 2 diabetes, hypertension, heart disease, and cancer. The Pritikin Program is recommended and has been proven to help reduce, reverse, and/or prevent the damaging effects of metabolic syndrome.  Nutrition   Overview of the Pritikin Eating Plan  Clinical staff conducted group or individual video education with verbal and written material and guidebook.  Patient learns about the [place] for disease risk reduction. The [place] emphasizes a wide variety of unrefined, minimally-processed carbohydrates, like fruits, vegetables, whole grains, and legumes. Go, Caution, and Stop food choices are explained. Plant-based and lean animal proteins are emphasized. Rationale provided for low sodium intake for blood pressure control, low added sugars for blood sugar stabilization, and low added fats and oils for coronary artery disease risk reduction and weight management.  Calorie Density  Clinical staff conducted group or individual video education with verbal and written material and guidebook.  Patient learns about calorie density and how it impacts the Pritikin Eating Plan. Knowing the characteristics of the food  you choose will help you decide whether those foods will lead to weight gain or weight loss, and whether you want to consume more or less of them. Weight loss is usually a side effect of the Pritikin Eating Plan because of its focus on low calorie-dense foods.  Label Reading  Clinical staff conducted group or individual video education with verbal and written material and guidebook.  Patient learns about the Pritikin recommended label reading guidelines and corresponding recommendations regarding calorie density, added sugars, sodium content, and whole grains.  Dining Out - Part 1  Clinical staff conducted group or individual video education with verbal and written material and guidebook.  Patient learns that restaurant meals can be sabotaging because they can be so high in calories, fat, sodium, and/or sugar. Patient  learns recommended strategies on how to positively address this and avoid unhealthy pitfalls.  Facts on Fats  Clinical staff conducted group or individual video education with verbal and written material and guidebook.  Patient learns that lifestyle modifications can be just as effective, if not more so, as many medications for lowering your risk of heart disease. A Pritikin lifestyle can help to reduce your risk of inflammation and atherosclerosis (cholesterol build-up, or plaque, in the artery walls). Lifestyle interventions such as dietary choices and physical activity address the cause of atherosclerosis. A review of the types of fats and their impact on blood cholesterol levels, along with dietary recommendations to reduce fat intake is also included.  Nutrition Action Plan  Clinical staff conducted group or individual video education with verbal and written material and guidebook.  Patient learns how to incorporate Pritikin recommendations into their lifestyle. Recommendations include planning and keeping personal health goals in mind as an important part of their  success.  Healthy Mind-Set    Healthy Minds, Bodies, Hearts  Clinical staff conducted group or individual video education with verbal and written material and guidebook.  Patient learns how to identify when they are stressed. Video will discuss the impact of that stress, as well as the many benefits of stress management. Patient will also be introduced to stress management techniques. The way we think, act, and feel has an impact on our hearts.  How Our Thoughts Can Heal Our Hearts  Clinical staff conducted group or individual video education with verbal and written material and guidebook.  Patient learns that negative thoughts can cause depression and anxiety. This can result in negative lifestyle behavior and serious health problems. Cognitive behavioral therapy is an effective method to help control our thoughts in order to change and improve our emotional outlook.  Additional Videos:  Exercise    Improving Performance  Clinical staff conducted group or individual video education with verbal and written material and guidebook.  Patient learns to use a non-linear approach by alternating intensity levels and lengths of time spent exercising to help burn more calories and lose more body fat. Cardiovascular exercise helps improve heart health, metabolism, hormonal balance, blood sugar control, and recovery from fatigue. Resistance training improves strength, endurance, balance, coordination, reaction time, metabolism, and muscle mass. Flexibility exercise improves circulation, posture, and balance. Seek guidance from your physician and exercise physiologist before implementing an exercise routine and learn your capabilities and proper form for all exercise.  Introduction to Yoga  Clinical staff conducted group or individual video education with verbal and written material and guidebook.  Patient learns about yoga, a discipline of the coming together of mind, breath, and body. The benefits of yoga  include improved flexibility, improved range of motion, better posture and core strength, increased lung function, weight loss, and positive self-image. Yoga's heart health benefits include lowered blood pressure, healthier heart rate, decreased cholesterol and triglyceride levels, improved immune function, and reduced stress. Seek guidance from your physician and exercise physiologist before implementing an exercise routine and learn your capabilities and proper form for all exercise.  Medical   Aging: Enhancing Your Quality of Life  Clinical staff conducted group or individual video education with verbal and written material and guidebook.  Patient learns key strategies and recommendations to stay in good physical health and enhance quality of life, such as prevention strategies, having an advocate, securing a [place], and keeping a list of medications and system for tracking them. It  also discusses how to avoid risk for bone loss.  Biology of Weight Control  Clinical staff conducted group or individual video education with verbal and written material and guidebook.  Patient learns that weight gain occurs because we consume more calories than we burn (eating more, moving less). Even if your body weight is normal, you may have higher ratios of fat compared to muscle mass. Too much body fat puts you at increased risk for cardiovascular disease, heart attack, stroke, type 2 diabetes, and obesity-related cancers. In addition to exercise, following the [place] can help reduce your risk.  Decoding Lab Results  Clinical staff conducted group or individual video education with verbal and written material and guidebook.  Patient learns that lab test reflects one measurement whose values change over time and are influenced by many factors, including medication, stress, sleep, exercise, food, hydration, pre-existing medical conditions, and more. It is recommended to  use the knowledge from this video to become more involved with your lab results and evaluate your numbers to speak with your doctor.   Diseases of Our Time - Overview  Clinical staff conducted group or individual video education with verbal and written material and guidebook.  Patient learns that according to the CDC, 50% to 70% of chronic diseases (such as obesity, type 2 diabetes, elevated lipids, hypertension, and heart disease) are avoidable through lifestyle improvements including healthier food choices, listening to satiety cues, and increased physical activity.  Sleep Disorders Clinical staff conducted group or individual video education with verbal and written material and guidebook.  Patient learns how good quality and duration of sleep are important to overall health and well-being. Patient also learns about sleep disorders and how they impact health along with recommendations to address them, including discussing with a physician.  Nutrition  Dining Out - Part 2 Clinical staff conducted group or individual video education with verbal and written material and guidebook.  Patient learns how to plan ahead and communicate in order to maximize their dining experience in a healthy and nutritious manner. Included are recommended food choices based on the type of restaurant the patient is visiting.   Fueling a Best boy conducted group or individual video education with verbal and written material and guidebook.  There is a strong connection between our food choices and our health. Diseases like obesity and type 2 diabetes are very prevalent and are in large-part due to lifestyle choices. The Pritikin Eating Plan provides plenty of food and hunger-curbing satisfaction. It is easy to follow, affordable, and helps reduce health risks.  Menu Workshop  Clinical staff conducted group or individual video education with verbal and written material and guidebook.  Patient learns  that restaurant meals can sabotage health goals because they are often packed with calories, fat, sodium, and sugar. Recommendations include strategies to plan ahead and to communicate with the manager, chef, or server to help order a healthier meal.  Planning Your Eating Strategy  Clinical staff conducted group or individual video education with verbal and written material and guidebook.  Patient learns about the [place] and its benefit of reducing the risk of disease. The [place] does not focus on calories. Instead, it emphasizes high-quality, nutrient-rich foods. By knowing the characteristics of the foods, we choose, we can determine their calorie density and make informed decisions.  Targeting Your Nutrition Priorities  Clinical staff conducted group or individual video education with verbal and written material and guidebook.  Patient learns that  lifestyle habits have a tremendous impact on disease risk and progression. This video provides eating and physical activity recommendations based on your personal health goals, such as reducing LDL cholesterol, losing weight, preventing or controlling type 2 diabetes, and reducing high blood pressure.  Vitamins and Minerals  Clinical staff conducted group or individual video education with verbal and written material and guidebook.  Patient learns different ways to obtain key vitamins and minerals, including through a recommended healthy diet. It is important to discuss all supplements you take with your doctor.   Healthy Mind-Set    Smoking Cessation  Clinical staff conducted group or individual video education with verbal and written material and guidebook.  Patient learns that cigarette smoking and tobacco addiction pose a serious health risk which affects millions of people. Stopping smoking will significantly reduce the risk of heart disease, lung disease, and many forms of cancer. Recommended strategies for quitting  are covered, including working with your doctor to develop a successful plan.  Culinary   Becoming a Financial trader conducted group or individual video education with verbal and written material and guidebook.  Patient learns that cooking at home can be healthy, cost-effective, quick, and puts them in control. Keys to cooking healthy recipes will include looking at your recipe, assessing your equipment needs, planning ahead, making it simple, choosing cost-effective seasonal ingredients, and limiting the use of added fats, salts, and sugars.  Cooking - Breakfast and Snacks  Clinical staff conducted group or individual video education with verbal and written material and guidebook.  Patient learns how important breakfast is to satiety and nutrition through the entire day. Recommendations include key foods to eat during breakfast to help stabilize blood sugar levels and to prevent overeating at meals later in the day. Planning ahead is also a key component.  Cooking - Human resources officer conducted group or individual video education with verbal and written material and guidebook.  Patient learns eating strategies to improve overall health, including an approach to cook more at home. Recommendations include thinking of animal protein as a side on your plate rather than center stage and focusing instead on lower calorie dense options like vegetables, fruits, whole grains, and plant-based proteins, such as beans. Making sauces in large quantities to freeze for later and leaving the skin on your vegetables are also recommended to maximize your experience.  Cooking - Healthy Salads and Dressing Clinical staff conducted group or individual video education with verbal and written material and guidebook.  Patient learns that vegetables, fruits, whole grains, and legumes are the foundations of the [place]. Recommendations include how to incorporate each of these in  flavorful and healthy salads, and how to create homemade salad dressings. Proper handling of ingredients is also covered. Cooking - Soups and [employer] - Soups and Desserts Clinical staff conducted group or individual video education with verbal and written material and guidebook.  Patient learns that Pritikin soups and desserts make for easy, nutritious, and delicious snacks and meal components that are low in sodium, fat, sugar, and calorie density, while high in vitamins, minerals, and filling fiber. Recommendations include simple and healthy ideas for soups and desserts.   Overview     The Pritikin Solution Program Overview Clinical staff conducted group or individual video education with verbal and written material and guidebook.  Patient learns that the results of the [place] Program have been documented in more than 100 articles published in peer-reviewed journals, and  the benefits include reducing risk factors for (and, in some cases, even reversing) high cholesterol, high blood pressure, type 2 diabetes, obesity, and more! An overview of the three key pillars of the Pritikin Program will be covered: eating well, doing regular exercise, and having a healthy mind-set.  WORKSHOPS  Exercise: Exercise Basics: Building Your Action Plan Clinical staff led group instruction and group discussion with PowerPoint presentation and patient guidebook. To enhance the learning environment the use of posters, models and videos may be added. At the conclusion of this workshop, patients will comprehend the difference between physical activity and exercise, as well as the benefits of incorporating both, into their routine. Patients will understand the FITT (Frequency, Intensity, Time, and Type) principle and how to use it to build an exercise action plan. In addition, safety concerns and other considerations for exercise and cardiac rehab will be addressed by the presenter. The purpose of this  lesson is to promote a comprehensive and effective weekly exercise routine in order to improve patients' overall level of fitness.   Managing Heart Disease: Your Path to a Healthier Heart Clinical staff led group instruction and group discussion with PowerPoint presentation and patient guidebook. To enhance the learning environment the use of posters, models and videos may be added.At the conclusion of this workshop, patients will understand the anatomy and physiology of the heart. Additionally, they will understand how Pritikin's three pillars impact the risk factors, the progression, and the management of heart disease.  The purpose of this lesson is to provide a high-level overview of the heart, heart disease, and how the Pritikin lifestyle positively impacts risk factors.  Exercise Biomechanics Clinical staff led group instruction and group discussion with PowerPoint presentation and patient guidebook. To enhance the learning environment the use of posters, models and videos may be added. Patients will learn how the structural parts of their bodies function and how these functions impact their daily activities, movement, and exercise. Patients will learn how to promote a neutral spine, learn how to manage pain, and identify ways to improve their physical movement in order to promote healthy living. The purpose of this lesson is to expose patients to common physical limitations that impact physical activity. Participants will learn practical ways to adapt and manage aches and pains, and to minimize their effect on regular exercise. Patients will learn how to maintain good posture while sitting, walking, and lifting.  Balance Training and Fall Prevention  Clinical staff led group instruction and group discussion with PowerPoint presentation and patient guidebook. To enhance the learning environment the use of posters, models and videos may be added. At the conclusion of this workshop,  patients will understand the importance of their sensorimotor skills (vision, proprioception, and the vestibular system) in maintaining their ability to balance as they age. Patients will apply a variety of balancing exercises that are appropriate for their current level of function. Patients will understand the common causes for poor balance, possible solutions to these problems, and ways to modify their physical environment in order to minimize their fall risk. The purpose of this lesson is to teach patients about the importance of maintaining balance as they age and ways to minimize their risk of falling.  WORKSHOPS   Nutrition:  Fueling a Scientist, research (physical sciences) led group instruction and group discussion with PowerPoint presentation and patient guidebook. To enhance the learning environment the use of posters, models and videos may be added. Patients will review the foundational principles of the [place]  and understand what constitutes a serving size in each of the food groups. Patients will also learn Pritikin-friendly foods that are better choices when away from home and review make-ahead meal and snack options. Calorie density will be reviewed and applied to three nutrition priorities: weight maintenance, weight loss, and weight gain. The purpose of this lesson is to reinforce (in a group setting) the key concepts around what patients are recommended to eat and how to apply these guidelines when away from home by planning and selecting Pritikin-friendly options. Patients will understand how calorie density may be adjusted for different weight management goals.  Mindful Eating  Clinical staff led group instruction and group discussion with PowerPoint presentation and patient guidebook. To enhance the learning environment the use of posters, models and videos may be added. Patients will briefly review the concepts of the [place] and the importance of low-calorie dense  foods. The concept of mindful eating will be introduced as well as the importance of paying attention to internal hunger signals. Triggers for non-hunger eating and techniques for dealing with triggers will be explored. The purpose of this lesson is to provide patients with the opportunity to review the basic principles of the [place], discuss the value of eating mindfully and how to measure internal cues of hunger and fullness using the Hunger Scale. Patients will also discuss reasons for non-hunger eating and learn strategies to use for controlling emotional eating.  Targeting Your Nutrition Priorities Clinical staff led group instruction and group discussion with PowerPoint presentation and patient guidebook. To enhance the learning environment the use of posters, models and videos may be added. Patients will learn how to determine their genetic susceptibility to disease by reviewing their family history. Patients will gain insight into the importance of diet as part of an overall healthy lifestyle in mitigating the impact of genetics and other environmental insults. The purpose of this lesson is to provide patients with the opportunity to assess their personal nutrition priorities by looking at their family history, their own health history and current risk factors. Patients will also be able to discuss ways of prioritizing and modifying the [place] for their highest risk areas  Menu  Clinical staff led group instruction and group discussion with PowerPoint presentation and patient guidebook. To enhance the learning environment the use of posters, models and videos may be added. Using menus brought in from [employer], or printed from [employer], patients will apply the [place] dining out guidelines that were presented in the [employer] video. Patients will also be able to practice these guidelines in a variety of provided scenarios. The purpose of  this lesson is to provide patients with the opportunity to practice hands-on learning of the [place] with actual menus and practice scenarios.  Label Reading Clinical staff led group instruction and group discussion with PowerPoint presentation and patient guidebook. To enhance the learning environment the use of posters, models and videos may be added. Patients will review and discuss the [facility] label reading guidelines presented in [employer]'s Label Reading Educational series video. Using fool labels brought in from local grocery stores and markets, patients will apply the label reading guidelines and determine if the packaged food meet the [facility] guidelines. The purpose of this lesson is to provide patients with the opportunity to review, discuss, and practice hands-on learning of the [facility] Label Reading guidelines with actual packaged food labels. [place] Workshops are designed  to teach patients ways to prepare quick, simple, and affordable recipes at home. The importance of nutrition's role in chronic disease risk reduction is reflected in its emphasis in the overall [facility] program. By learning how to prepare essential core [facility] Eating Plan recipes, patients will increase control over what they eat; be able to customize the flavor of foods without the use of added salt, sugar, or fat; and improve the quality of the food they consume. By learning a set of core recipes which are easily assembled, quickly prepared, and affordable, patients are more likely to prepare more healthy foods at home. These workshops focus on convenient breakfasts, simple entres, side dishes, and desserts which can be prepared with minimal effort and are consistent with nutrition recommendations for cardiovascular risk reduction. Cooking [employer] are taught by a Engineer, materials (RD) who has been trained by the [employer]. The  chef or RD has a clear understanding of the importance of minimizing - if not completely eliminating - added fat, sugar, and sodium in recipes. Throughout the series of [place] Workshop sessions, patients will learn about healthy ingredients and efficient methods of cooking to build confidence in their capability to prepare    Cooking School weekly topics:  Adding Flavor- Sodium-Free  Fast and Healthy Breakfasts  Powerhouse Plant-Based Proteins  Satisfying Salads and Dressings  Simple Sides and Sauces  International Cuisine-Spotlight on the [place] Zones  Delicious Desserts  Savory Soups  Efficiency Cooking - Meals in a Snap  Tasty Appetizers and Snacks  Comforting Weekend Breakfasts  One-Pot Wonders   Fast Evening Meals  Easy [place] (Psychosocial): New Thoughts, New Behaviors Clinical staff led group instruction and group discussion with PowerPoint presentation and patient guidebook. To enhance the learning environment the use of posters, models and videos may be added. Patients will learn and practice techniques for developing effective health and lifestyle goals. Patients will be able to effectively apply the goal setting process learned to develop at least one new personal goal.  The purpose of this lesson is to expose patients to a new skill set of behavior modification techniques such as techniques setting SMART goals, overcoming barriers, and achieving new thoughts and new behaviors.  Managing Moods and Relationships Clinical staff led group instruction and group discussion with PowerPoint presentation and patient guidebook. To enhance the learning environment the use of posters, models and videos may be added. Patients will learn how emotional and chronic stress factors can impact their health and relationships. They will learn healthy ways to manage their moods and utilize positive coping mechanisms. In  addition, ICR patients will learn ways to improve communication skills. The purpose of this lesson is to expose patients to ways of understanding how one's mood and health are intimately connected. Developing a healthy outlook can help build positive relationships and connections with others. Patients will understand the importance of utilizing effective communication skills that include actively listening and being heard. They will learn and understand the importance of the "4 Cs" and especially Connections in fostering of a Healthy Mind-Set.  Healthy Sleep for a Healthy Heart Clinical staff led group instruction and group discussion with PowerPoint presentation and patient guidebook. To enhance the learning environment the use of posters, models and videos may be added. At the conclusion of this workshop, patients will be able to demonstrate knowledge of the importance of sleep to overall health, well-being, and quality of life.  They will understand the symptoms of, and treatments for, common sleep disorders. Patients will also be able to identify daytime and nighttime behaviors which impact sleep, and they will be able to apply these tools to help manage sleep-related challenges. The purpose of this lesson is to provide patients with a general overview of sleep and outline the importance of quality sleep. Patients will learn about a few of the most common sleep disorders. Patients will also be introduced to the concept of "sleep hygiene," and discover ways to self-manage certain sleeping problems through simple daily behavior changes. Finally, the workshop will motivate patients by clarifying the links between quality sleep and their goals of heart-healthy living.   Recognizing and Reducing Stress Clinical staff led group instruction and group discussion with PowerPoint presentation and patient guidebook. To enhance the learning environment the use of posters, models and videos may be added. At the  conclusion of this workshop, patients will be able to understand the types of stress reactions, differentiate between acute and chronic stress, and recognize the impact that chronic stress has on their health. They will also be able to apply different coping mechanisms, such as reframing negative self-talk. Patients will have the opportunity to practice a variety of stress management techniques, such as deep abdominal breathing, progressive muscle relaxation, and/or guided imagery.  The purpose of this lesson is to educate patients on the role of stress in their lives and to provide healthy techniques for coping with it.  Learning Barriers/Preferences:  Learning Barriers/Preferences - 03/24/22 1220       Learning Barriers/Preferences   Learning Barriers Exercise Concerns;Sight;Hearing   Wears reading glasses reports feeling dizzy at times. Hard of hearing does not have hearing aides.   Learning Preferences Written Material;Pictoral             Education Topics:  Knowledge Questionnaire Score:  Knowledge Questionnaire Score - 03/24/22 1255       Knowledge Questionnaire Score   Pre Score 18/24             Core Components/Risk Factors/Patient Goals at Admission:  Personal Goals and Risk Factors at Admission - 03/24/22 1255       Core Components/Risk Factors/Patient Goals on Admission    Weight Management Yes;Obesity;Weight Loss    Intervention Weight Management/Obesity: Establish reasonable short term and long term weight goals.;Obesity: Provide education and appropriate resources to help participant work on and attain dietary goals.    Admit Weight 151 lb 7.3 oz (68.7 kg)    Expected Outcomes Short Term: Continue to assess and modify interventions until short term weight is achieved;Long Term: Adherence to nutrition and physical activity/exercise program aimed toward attainment of established weight goal;Weight Loss: Understanding of general recommendations for a balanced deficit  meal plan, which promotes 1-2 lb weight loss per week and includes a negative energy balance of [phone] kcal/d    Diabetes Yes    Intervention Provide education about signs/symptoms and action to take for hypo/hyperglycemia.;Provide education about proper nutrition, including hydration, and aerobic/resistive exercise prescription along with prescribed medications to achieve blood glucose in normal ranges: Fasting glucose 65-99 mg/dL    Expected Outcomes Short Term: Participant verbalizes understanding of the signs/symptoms and immediate care of hyper/hypoglycemia, proper foot care and importance of medication, aerobic/resistive exercise and nutrition plan for blood glucose control.;Long Term: Attainment of HbA1C < 7%.    Hypertension Yes    Intervention Provide education on lifestyle modifcations including regular physical activity/exercise, weight management, moderate sodium restriction and increased consumption  of fresh fruit, vegetables, and low fat dairy, alcohol moderation, and smoking cessation.;Monitor prescription use compliance.    Expected Outcomes Short Term: Continued assessment and intervention until BP is < 140/17m HG in hypertensive participants. < 130/866mHG in hypertensive participants with diabetes, heart failure or chronic kidney disease.;Long Term: Maintenance of blood pressure at goal levels.    Lipids Yes    Intervention Provide education and support for participant on nutrition & aerobic/resistive exercise along with prescribed medications to achieve LDL <7021mHDL >33m22m  Expected Outcomes Short Term: Participant states understanding of desired cholesterol values and is compliant with medications prescribed. Participant is following exercise prescription and nutrition guidelines.;Long Term: Cholesterol controlled with medications as prescribed, with individualized exercise RX and with personalized nutrition plan. Value goals: LDL < 70mg66mL > 40 mg.    Personal Goal Other Yes     Personal Goal Start exercise program. Know how much she can lift. Decrease risk for a heart attack.    Intervention Provide education on heart healthy eating, healthy mindset, and individualized exercise program including aerobic, resistance, and stretching routine to decrease risk for future cardiac event.    Expected Outcomes Patient will understand parameters for exercise and safe lifting. Patient will make healthy lifestyle changes to decrease risk for future cardiac event/heart attack.             Core Components/Risk Factors/Patient Goals Review:   Goals and Risk Factor Review     Row Name 04/01/22 1610 04/28/22 1521           Core Components/Risk Factors/Patient Goals Review   Personal Goals Review Weight Management/Obesity;Hypertension;Diabetes;Lipids;Stress Weight Management/Obesity;Hypertension;Diabetes;Lipids;Stress      Review Victoria sYork Ceriseted intensive cardiac rehab on 04/01/22 and did well with exercise. Vital signs and CBG's were stable Victoria c[place] Ceriseinues to do well with exercise at intensive cardiac rehab. Vital signs and CBG's were stable      Expected Outcomes Victoria wYork Ferguson continue to participate in intensive cardiac rehab for exercise, nutrition and lifestyle modifications Victoria wYork Ferguson continue to participate in intensive cardiac rehab for exercise, nutrition and lifestyle modifications               Core Components/Risk Factors/Patient Goals at Discharge (Final Review):   Goals and Risk Factor Review - 04/28/22 1521       Core Components/Risk Factors/Patient Goals Review   Personal Goals Review Weight Management/Obesity;Hypertension;Diabetes;Lipids;Stress    Review Victoria cYork Ceriseinues to do well with exercise at intensive cardiac rehab. Vital signs and CBG's were stable    Expected Outcomes Victoria wYork Ferguson continue to participate in intensive cardiac rehab for exercise, nutrition and lifestyle modifications             ITP Comments:  ITP Comments     Row Name 03/24/22 1058  04/01/22 1414 04/28/22 1517       ITP Comments Medical Director- Dr. TraciFransico Him Introduction to [facility] Education Program/ Intensive Cardiac Rehab Orientatin Packet reviewed with the patient 30 Day ITP Review. LucilShaquoyated intensive cardiac rehab on 04/01/22 amd did well with exercise. 30 Day ITP Review. LucilSeciliagood attendance and participation in  intensive cardiac rehab on 04/01/22.              Comments: See ITP comments.MariaHarrell GaveSN

## 2022-04-29 ENCOUNTER — Encounter (HOSPITAL_COMMUNITY)
Admission: RE | Admit: 2022-04-29 | Discharge: 2022-04-29 | Disposition: A | Discharge status: Home / Self Care | Payer: Medicare Other | Source: Ambulatory Visit | Attending: Cardiology | Admitting: Cardiology

## 2022-04-29 DIAGNOSIS — I2089 Other forms of angina pectoris: Secondary | ICD-10-CM | POA: Insufficient documentation

## 2022-04-29 DIAGNOSIS — Z5189 Encounter for other specified aftercare: Secondary | ICD-10-CM | POA: Insufficient documentation

## 2022-04-29 DIAGNOSIS — E669 Obesity, unspecified: Secondary | ICD-10-CM | POA: Diagnosis not present

## 2022-04-29 DIAGNOSIS — I110 Hypertensive heart disease with heart failure: Secondary | ICD-10-CM | POA: Diagnosis not present

## 2022-04-29 DIAGNOSIS — E119 Type 2 diabetes mellitus without complications: Secondary | ICD-10-CM | POA: Insufficient documentation

## 2022-04-29 DIAGNOSIS — R5383 Other fatigue: Secondary | ICD-10-CM | POA: Insufficient documentation

## 2022-05-01 ENCOUNTER — Encounter (HOSPITAL_COMMUNITY)
Admission: RE | Admit: 2022-05-01 | Discharge: 2022-05-01 | Disposition: A | Discharge status: Home / Self Care | Payer: Medicare Other | Source: Ambulatory Visit | Attending: Cardiology | Admitting: Cardiology

## 2022-05-01 DIAGNOSIS — R5383 Other fatigue: Secondary | ICD-10-CM | POA: Diagnosis not present

## 2022-05-01 DIAGNOSIS — I2089 Other forms of angina pectoris: Secondary | ICD-10-CM

## 2022-05-01 DIAGNOSIS — E669 Obesity, unspecified: Secondary | ICD-10-CM | POA: Diagnosis not present

## 2022-05-01 DIAGNOSIS — I110 Hypertensive heart disease with heart failure: Secondary | ICD-10-CM | POA: Diagnosis not present

## 2022-05-01 DIAGNOSIS — E119 Type 2 diabetes mellitus without complications: Secondary | ICD-10-CM | POA: Diagnosis not present

## 2022-05-01 DIAGNOSIS — Z5189 Encounter for other specified aftercare: Secondary | ICD-10-CM | POA: Diagnosis not present

## 2022-05-04 ENCOUNTER — Encounter (HOSPITAL_COMMUNITY): Payer: Medicare Other

## 2022-05-06 ENCOUNTER — Encounter (HOSPITAL_COMMUNITY)
Admission: RE | Admit: 2022-05-06 | Discharge: 2022-05-06 | Disposition: A | Discharge status: Home / Self Care | Payer: Medicare Other | Source: Ambulatory Visit | Attending: Cardiology | Admitting: Cardiology

## 2022-05-06 DIAGNOSIS — Z5189 Encounter for other specified aftercare: Secondary | ICD-10-CM | POA: Diagnosis not present

## 2022-05-06 DIAGNOSIS — R5383 Other fatigue: Secondary | ICD-10-CM | POA: Diagnosis not present

## 2022-05-06 DIAGNOSIS — E669 Obesity, unspecified: Secondary | ICD-10-CM | POA: Diagnosis not present

## 2022-05-06 DIAGNOSIS — E119 Type 2 diabetes mellitus without complications: Secondary | ICD-10-CM | POA: Diagnosis not present

## 2022-05-06 DIAGNOSIS — I110 Hypertensive heart disease with heart failure: Secondary | ICD-10-CM | POA: Diagnosis not present

## 2022-05-06 DIAGNOSIS — I2089 Other forms of angina pectoris: Secondary | ICD-10-CM | POA: Diagnosis not present

## 2022-05-08 ENCOUNTER — Encounter (HOSPITAL_COMMUNITY)
Admission: RE | Admit: 2022-05-08 | Discharge: 2022-05-08 | Disposition: A | Discharge status: Home / Self Care | Payer: Medicare Other | Source: Ambulatory Visit | Attending: Cardiology | Admitting: Cardiology

## 2022-05-08 DIAGNOSIS — E669 Obesity, unspecified: Secondary | ICD-10-CM | POA: Diagnosis not present

## 2022-05-08 DIAGNOSIS — Z5189 Encounter for other specified aftercare: Secondary | ICD-10-CM | POA: Diagnosis not present

## 2022-05-08 DIAGNOSIS — I2089 Other forms of angina pectoris: Secondary | ICD-10-CM

## 2022-05-08 DIAGNOSIS — R5383 Other fatigue: Secondary | ICD-10-CM | POA: Diagnosis not present

## 2022-05-08 DIAGNOSIS — I110 Hypertensive heart disease with heart failure: Secondary | ICD-10-CM | POA: Diagnosis not present

## 2022-05-08 DIAGNOSIS — E119 Type 2 diabetes mellitus without complications: Secondary | ICD-10-CM | POA: Diagnosis not present

## 2022-05-11 ENCOUNTER — Encounter (HOSPITAL_COMMUNITY): Payer: Medicare Other

## 2022-05-13 ENCOUNTER — Encounter (HOSPITAL_COMMUNITY)
Admission: RE | Admit: 2022-05-13 | Discharge: 2022-05-13 | Disposition: A | Discharge status: Home / Self Care | Payer: Medicare Other | Source: Ambulatory Visit | Attending: Cardiology | Admitting: Cardiology

## 2022-05-13 DIAGNOSIS — E669 Obesity, unspecified: Secondary | ICD-10-CM | POA: Diagnosis not present

## 2022-05-13 DIAGNOSIS — I2089 Other forms of angina pectoris: Secondary | ICD-10-CM | POA: Diagnosis not present

## 2022-05-13 DIAGNOSIS — E119 Type 2 diabetes mellitus without complications: Secondary | ICD-10-CM | POA: Diagnosis not present

## 2022-05-13 DIAGNOSIS — R5383 Other fatigue: Secondary | ICD-10-CM | POA: Diagnosis not present

## 2022-05-13 DIAGNOSIS — Z5189 Encounter for other specified aftercare: Secondary | ICD-10-CM | POA: Diagnosis not present

## 2022-05-13 DIAGNOSIS — I110 Hypertensive heart disease with heart failure: Secondary | ICD-10-CM | POA: Diagnosis not present

## 2022-05-15 ENCOUNTER — Encounter (HOSPITAL_COMMUNITY)
Admission: RE | Admit: 2022-05-15 | Discharge: 2022-05-15 | Disposition: A | Discharge status: Home / Self Care | Payer: Medicare Other | Source: Ambulatory Visit | Attending: Cardiology | Admitting: Cardiology

## 2022-05-15 DIAGNOSIS — I2089 Other forms of angina pectoris: Secondary | ICD-10-CM

## 2022-05-15 DIAGNOSIS — Z5189 Encounter for other specified aftercare: Secondary | ICD-10-CM | POA: Diagnosis not present

## 2022-05-15 DIAGNOSIS — R5383 Other fatigue: Secondary | ICD-10-CM | POA: Diagnosis not present

## 2022-05-15 DIAGNOSIS — E119 Type 2 diabetes mellitus without complications: Secondary | ICD-10-CM | POA: Diagnosis not present

## 2022-05-15 DIAGNOSIS — E669 Obesity, unspecified: Secondary | ICD-10-CM | POA: Diagnosis not present

## 2022-05-15 DIAGNOSIS — I110 Hypertensive heart disease with heart failure: Secondary | ICD-10-CM | POA: Diagnosis not present

## 2022-05-18 ENCOUNTER — Encounter (HOSPITAL_COMMUNITY): Payer: Medicare Other

## 2022-05-19 DIAGNOSIS — E785 Hyperlipidemia, unspecified: Secondary | ICD-10-CM | POA: Diagnosis not present

## 2022-05-19 DIAGNOSIS — I2510 Atherosclerotic heart disease of native coronary artery without angina pectoris: Secondary | ICD-10-CM | POA: Diagnosis not present

## 2022-05-19 DIAGNOSIS — N183 Chronic kidney disease, stage 3 unspecified: Secondary | ICD-10-CM | POA: Diagnosis not present

## 2022-05-19 DIAGNOSIS — E782 Mixed hyperlipidemia: Secondary | ICD-10-CM | POA: Diagnosis not present

## 2022-05-19 DIAGNOSIS — E559 Vitamin D deficiency, unspecified: Secondary | ICD-10-CM | POA: Diagnosis not present

## 2022-05-19 DIAGNOSIS — D473 Essential (hemorrhagic) thrombocythemia: Secondary | ICD-10-CM | POA: Diagnosis not present

## 2022-05-19 DIAGNOSIS — E119 Type 2 diabetes mellitus without complications: Secondary | ICD-10-CM | POA: Diagnosis not present

## 2022-05-19 DIAGNOSIS — M810 Age-related osteoporosis without current pathological fracture: Secondary | ICD-10-CM | POA: Diagnosis not present

## 2022-05-19 DIAGNOSIS — E039 Hypothyroidism, unspecified: Secondary | ICD-10-CM | POA: Diagnosis not present

## 2022-05-19 DIAGNOSIS — F419 Anxiety disorder, unspecified: Secondary | ICD-10-CM | POA: Diagnosis not present

## 2022-05-19 DIAGNOSIS — E1142 Type 2 diabetes mellitus with diabetic polyneuropathy: Secondary | ICD-10-CM | POA: Diagnosis not present

## 2022-05-19 DIAGNOSIS — I10 Essential (primary) hypertension: Secondary | ICD-10-CM | POA: Diagnosis not present

## 2022-05-19 DIAGNOSIS — K219 Gastro-esophageal reflux disease without esophagitis: Secondary | ICD-10-CM | POA: Diagnosis not present

## 2022-05-20 ENCOUNTER — Encounter (HOSPITAL_COMMUNITY)
Admission: RE | Admit: 2022-05-20 | Discharge: 2022-05-20 | Disposition: A | Discharge status: Home / Self Care | Payer: Medicare Other | Source: Ambulatory Visit | Attending: Cardiology | Admitting: Cardiology

## 2022-05-20 DIAGNOSIS — I110 Hypertensive heart disease with heart failure: Secondary | ICD-10-CM | POA: Diagnosis not present

## 2022-05-20 DIAGNOSIS — R5383 Other fatigue: Secondary | ICD-10-CM | POA: Diagnosis not present

## 2022-05-20 DIAGNOSIS — Z5189 Encounter for other specified aftercare: Secondary | ICD-10-CM | POA: Diagnosis not present

## 2022-05-20 DIAGNOSIS — E669 Obesity, unspecified: Secondary | ICD-10-CM | POA: Diagnosis not present

## 2022-05-20 DIAGNOSIS — I2089 Other forms of angina pectoris: Secondary | ICD-10-CM

## 2022-05-20 DIAGNOSIS — E119 Type 2 diabetes mellitus without complications: Secondary | ICD-10-CM | POA: Diagnosis not present

## 2022-05-22 ENCOUNTER — Encounter (HOSPITAL_COMMUNITY)
Admission: RE | Admit: 2022-05-22 | Discharge: 2022-05-22 | Disposition: A | Discharge status: Home / Self Care | Payer: Medicare Other | Source: Ambulatory Visit | Attending: Cardiology | Admitting: Cardiology

## 2022-05-22 DIAGNOSIS — E119 Type 2 diabetes mellitus without complications: Secondary | ICD-10-CM | POA: Diagnosis not present

## 2022-05-22 DIAGNOSIS — I110 Hypertensive heart disease with heart failure: Secondary | ICD-10-CM | POA: Diagnosis not present

## 2022-05-22 DIAGNOSIS — Z5189 Encounter for other specified aftercare: Secondary | ICD-10-CM | POA: Diagnosis not present

## 2022-05-22 DIAGNOSIS — I2089 Other forms of angina pectoris: Secondary | ICD-10-CM | POA: Diagnosis not present

## 2022-05-22 DIAGNOSIS — E669 Obesity, unspecified: Secondary | ICD-10-CM | POA: Diagnosis not present

## 2022-05-22 DIAGNOSIS — R5383 Other fatigue: Secondary | ICD-10-CM | POA: Diagnosis not present

## 2022-05-27 ENCOUNTER — Encounter (HOSPITAL_COMMUNITY)
Admission: RE | Admit: 2022-05-27 | Discharge: 2022-05-27 | Disposition: A | Discharge status: Home / Self Care | Payer: Medicare Other | Source: Ambulatory Visit | Attending: Cardiology | Admitting: Cardiology

## 2022-05-27 DIAGNOSIS — Z5189 Encounter for other specified aftercare: Secondary | ICD-10-CM | POA: Diagnosis not present

## 2022-05-27 DIAGNOSIS — R5383 Other fatigue: Secondary | ICD-10-CM | POA: Diagnosis not present

## 2022-05-27 DIAGNOSIS — I2089 Other forms of angina pectoris: Secondary | ICD-10-CM

## 2022-05-27 DIAGNOSIS — E119 Type 2 diabetes mellitus without complications: Secondary | ICD-10-CM | POA: Diagnosis not present

## 2022-05-27 DIAGNOSIS — E669 Obesity, unspecified: Secondary | ICD-10-CM | POA: Diagnosis not present

## 2022-05-27 DIAGNOSIS — I110 Hypertensive heart disease with heart failure: Secondary | ICD-10-CM | POA: Diagnosis not present

## 2022-05-27 NOTE — Progress Notes (Signed)
Cardiac Individual Treatment Plan  Patient Details  Name: Victoria Ferguson MRN: [id number] Date of Birth: [birth year] Referring Provider:   Flowsheet Row INTENSIVE CARDIAC REHAB ORIENT from 03/24/2022 in [facility]  Referring Provider Lelon Perla, MD       Initial Encounter Date:  [place] from 03/24/2022 in [facility] for Heart, Vascular, & Lung Health  Date 03/24/22       Visit Diagnosis: Chronic stable angina  Patient's Home Medications on Admission:  Current Outpatient Medications:    aspirin EC 81 MG tablet, Take 1 tablet (81 mg total) by mouth daily. Swallow whole. (Patient taking differently: Take 81 mg by mouth at bedtime. Swallow whole.), Disp: 90 tablet, Rfl: 3   ergocalciferol (VITAMIN D2) 1.25 MG (50000 UT) capsule, Take 50,000 Units by mouth every Monday. WITH LUNCH, Disp: , Rfl:    ezetimibe (ZETIA) 10 MG tablet, Take 1 tablet (10 mg total) by mouth daily. (Patient taking differently: Take 10 mg by mouth daily with lunch.), Disp: 90 tablet, Rfl: 3   ferrous sulfate 325 (65 FE) MG tablet, Take 325 mg by mouth every 3 (three) days., Disp: , Rfl:    FREESTYLE LITE test strip, , Disp: , Rfl:    glipiZIDE (GLUCOTROL) 5 MG tablet, Take 5 mg by mouth daily with lunch., Disp: , Rfl:    hydrochlorothiazide (HYDRODIURIL) 25 MG tablet, Take 25 mg by mouth daily with lunch., Disp: , Rfl:    levothyroxine (SYNTHROID) 75 MCG tablet, Take 75 mcg by mouth daily., Disp: , Rfl:    losartan (COZAAR) 50 MG tablet, Take 50 mg by mouth daily with lunch., Disp: , Rfl:    metoprolol succinate (TOPROL XL) 25 MG 24 hr tablet, Take 1 tablet (25 mg total) by mouth daily. (Patient taking differently: Take 25 mg by mouth at bedtime.), Disp: 90 tablet, Rfl: 3   nitroGLYCERIN (NITROSTAT) 0.4 MG SL tablet, Place 1 tablet (0.4 mg total) under the tongue every 5 (five) minutes as  needed for chest pain., Disp: 25 tablet, Rfl: 3   potassium chloride (KLOR-CON M) 10 MEQ tablet, Take 10 mEq by mouth daily with lunch., Disp: , Rfl:    rosuvastatin (CRESTOR) 40 MG tablet, Take 1 tablet (40 mg total) by mouth daily., Disp: 90 tablet, Rfl: 3  Past Medical History: Past Medical History:  Diagnosis Date   Diabetes mellitus    Hyperlipemia    Hypertension    Hypothyroidism    Lung nodule     Tobacco Use: Social History   Tobacco Use  Smoking Status Former   Types: Cigarettes  Smokeless Tobacco Not on file    Labs: Review Flowsheet       Latest Ref Rng & Units 07/07/2021 09/24/2021  Labs for ITP Cardiac and Pulmonary Rehab  Cholestrol 100 - 199 mg/dL 159  116   LDL (calc) 0 - 99 mg/dL 72  41   Direct LDL 0 - 99 mg/dL - 39   HDL-C >39 mg/dL 60  49   Trlycerides 0 - 149 mg/dL 157  156     Capillary Blood Glucose: Lab Results  Component Value Date   GLUCAP 120 (H) 04/15/2022   GLUCAP 167 (H) 04/10/2022   GLUCAP 137 (H) 04/10/2022   GLUCAP 128 (H) 04/03/2022   GLUCAP 53 (L) 04/03/2022     Exercise Target Goals: Exercise Program Goal: Individual exercise prescription set using  results from initial 6 min walk test and THRR while considering  patient's activity barriers and safety.   Exercise Prescription Goal: Initial exercise prescription builds to 30-45 minutes a day of aerobic activity, 2-3 days per week.  Home exercise guidelines will be given to patient during program as part of exercise prescription that the participant will acknowledge.  Activity Barriers & Risk Stratification:  Activity Barriers & Cardiac Risk Stratification - 03/24/22 1209       Activity Barriers & Cardiac Risk Stratification   Activity Barriers Other (comment)    Comments Chronic knee pain, occassional bilateral ankle edema, and bilateral leg pain in the mornings.    Cardiac Risk Stratification High             6 Minute Walk:  6 Minute Walk     Row Name 03/24/22  1154         6 Minute Walk   Phase Initial     Distance 863 feet     Walk Time 6 minutes     # of Rest Breaks 1  Seated rest break at 3 mins 8 secs, restart at 4 mins 11 secs.     MPH 1.63     METS 1.23     RPE 11     Perceived Dyspnea  0     VO2 Peak 4.31     Symptoms Yes (comment)     Comments Patient c/o feeling lightheaded, which she thinks was related to wearing her reading glasses, resolved with rest and removal of readers. Seated rest break taken due to fatigue.     Resting HR 56 bpm     Resting BP 108/72     Resting Oxygen Saturation  97 %     Exercise Oxygen Saturation  during 6 min walk 99 %     Max Ex. HR 86 bpm     Max Ex. BP 138/60     2 Minute Post BP 112/64              Oxygen Initial Assessment:   Oxygen Re-Evaluation:   Oxygen Discharge (Final Oxygen Re-Evaluation):   Initial Exercise Prescription:  Initial Exercise Prescription - 03/24/22 1300       Date of Initial Exercise RX and Referring Provider   Date 03/24/22    Referring Provider Lelon Perla, MD    Expected Discharge Date 05/22/22      NuStep   Level 1    SPM 75    Minutes 25    METs 1.5      Prescription Details   Frequency (times per week) 2    Duration Progress to 30 minutes of continuous aerobic without signs/symptoms of physical distress      Intensity   THRR 40-80% of Max Heartrate 62-125    Ratings of Perceived Exertion 11-13    Perceived Dyspnea 0-4      Progression   Progression Continue to progress workloads to maintain intensity without signs/symptoms of physical distress.      Resistance Training   Training Prescription Yes    Weight 1 lb    Reps 10-15             Perform Capillary Blood Glucose checks as needed.  Exercise Prescription Changes:   Exercise Prescription Changes     Row Name 04/01/22 1600 04/17/22 1620 05/08/22 1625 05/20/22 1642       Response to Exercise   Blood Pressure (Admit) 102/82 102/70 108/74 110/70  Blood  Pressure (Exercise) 112/82 138/70 142/72 128/64    Blood Pressure (Exit) 100/58 104/72 112/62 112/66    Heart Rate (Admit) -- 66 bpm 59 bpm 58 bpm    Heart Rate (Exercise) 80 bpm 103 bpm 101 bpm 103 bpm    Heart Rate (Exit) 64 bpm 74 bpm 68 bpm 61 bpm    Rating of Perceived Exertion (Exercise) '11 11 10 11    '$ Perceived Dyspnea (Exercise) 0 0 0 0    Symptoms none none none none    Comments Pt first day of CRP2 program Reviewed MET's, goals and home ExRx Reviewed MET's Reviewed MET's and goals    Duration Progress to 30 minutes of  aerobic without signs/symptoms of physical distress Progress to 30 minutes of  aerobic without signs/symptoms of physical distress Progress to 30 minutes of  aerobic without signs/symptoms of physical distress Progress to 30 minutes of  aerobic without signs/symptoms of physical distress    Intensity THRR unchanged THRR unchanged THRR unchanged THRR unchanged      Progression   Progression Continue to progress workloads to maintain intensity without signs/symptoms of physical distress. Continue to progress workloads to maintain intensity without signs/symptoms of physical distress. Continue to progress workloads to maintain intensity without signs/symptoms of physical distress. Continue to progress workloads to maintain intensity without signs/symptoms of physical distress.    Average METs 1.7 2 2.2 2.3      Resistance Training   Training Prescription Yes Yes Yes No    Weight 1 lb 1 lb 1 lb --    Reps 10-15 10-15 10-15 --    Time 10 Minutes 10 Minutes 10 Minutes --      Interval Training   Interval Training No No No No      NuStep   Level '1 1 1  '$ increased level 2 last 10 mins 2    SPM 54 66 74 --    Minutes '27 27 30 30    '$ METs 1.7 2 2.2 2.3      Home Exercise Plan   Plans to continue exercise at -- Home (comment) Home (comment) Home (comment)    Frequency -- Add 2 additional days to program exercise sessions. Add 2 additional days to program exercise  sessions. Add 2 additional days to program exercise sessions.    Initial Home Exercises Provided -- 04/17/22 04/17/22 04/17/22             Exercise Comments:   Exercise Comments     Row Name 04/01/22 1551 04/17/22 1629 05/08/22 1626 05/20/22 1649     Exercise Comments Pt first day in the CRP2 program. Pt tolerated exercise well with an average MET level of 1.7. Pt is learning her THRR, RPE and ExRx. Pt does have some leg weakness, but we will work on gaining strength. Will continue to monitor pt and progress workloads as tolerated without sign or symptom. Reviewed MET's, goals and home ExRx today with pt. Pt tolerated exercise well with an average MET level of 2.0. Pt is feeling good about her goals, she now knows her limits to exercise and is gaining strength and MET's. Encouraged pt that adding exercise at home is a good way to increase strength and gain more heart strength. Pt will add in walking, and physical therapy exercise for her arms and legs that she enjoys doing for 1-2 days for 15-30 mins as tolerated with fatigue and with caring for her husband. Encouraged that as little as 10 min bouts  of exercise could help so get in what she can. Reviewed MET's. Pt tolerated exercise well with an average MET level of 2.0. Pt is progressing her SPM and increased workload today for incease in strength Reviewed MET's and goals and home ExRx. Pt tolerated exercise well with an average MET level of 2.3.Pt feels good about increasing strength and stamina and she is happy to be feeling good with exercise.             Exercise Goals and Review:   Exercise Goals     Row Name 03/24/22 1209             Exercise Goals   Increase Physical Activity Yes       Intervention Provide advice, education, support and counseling about physical activity/exercise needs.;Develop an individualized exercise prescription for aerobic and resistive training based on initial evaluation findings, risk  stratification, comorbidities and participant's personal goals.       Expected Outcomes Short Term: Attend rehab on a regular basis to increase amount of physical activity.;Long Term: Add in home exercise to make exercise part of routine and to increase amount of physical activity.;Long Term: Exercising regularly at least 3-5 days a week.       Increase Strength and Stamina Yes       Intervention Provide advice, education, support and counseling about physical activity/exercise needs.;Develop an individualized exercise prescription for aerobic and resistive training based on initial evaluation findings, risk stratification, comorbidities and participant's personal goals.       Expected Outcomes Short Term: Increase workloads from initial exercise prescription for resistance, speed, and METs.;Short Term: Perform resistance training exercises routinely during rehab and add in resistance training at home;Long Term: Improve cardiorespiratory fitness, muscular endurance and strength as measured by increased METs and functional capacity (6MWT)       Able to understand and use rate of perceived exertion (RPE) scale Yes       Intervention Provide education and explanation on how to use RPE scale       Expected Outcomes Short Term: Able to use RPE daily in rehab to express subjective intensity level;Long Term:  Able to use RPE to guide intensity level when exercising independently       Knowledge and understanding of Target Heart Rate Range (THRR) Yes       Intervention Provide education and explanation of THRR including how the numbers were predicted and where they are located for reference       Expected Outcomes Short Term: Able to state/look up THRR;Long Term: Able to use THRR to govern intensity when exercising independently;Short Term: Able to use daily as guideline for intensity in rehab       Able to check pulse independently Yes       Intervention Provide education and demonstration on how to check pulse  in carotid and radial arteries.;Review the importance of being able to check your own pulse for safety during independent exercise       Expected Outcomes Long Term: Able to check pulse independently and accurately;Short Term: Able to explain why pulse checking is important during independent exercise       Understanding of Exercise Prescription Yes       Intervention Provide education, explanation, and written materials on patient's individual exercise prescription       Expected Outcomes Short Term: Able to explain program exercise prescription;Long Term: Able to explain home exercise prescription to exercise independently  Exercise Goals Re-Evaluation :  Exercise Goals Re-Evaluation     Row Name 04/01/22 1546 04/17/22 1622 05/20/22 1648         Exercise Goal Re-Evaluation   Exercise Goals Review Increase Physical Activity;Increase Strength and Stamina;Able to understand and use rate of perceived exertion (RPE) scale;Knowledge and understanding of Target Heart Rate Range (THRR);Understanding of Exercise Prescription Increase Physical Activity;Increase Strength and Stamina;Able to understand and use rate of perceived exertion (RPE) scale;Knowledge and understanding of Target Heart Rate Range (THRR);Understanding of Exercise Prescription Increase Physical Activity;Increase Strength and Stamina;Able to understand and use rate of perceived exertion (RPE) scale;Knowledge and understanding of Target Heart Rate Range (THRR);Understanding of Exercise Prescription     Comments Pt first day in the CRP2 program. Pt tolerated exercise well with an average MET level of 1.7. Pt is learning her THRR, RPE and ExRx. Pt does have some leg weakness, but we will work on gaining strength Reviewed MET's, goals and home ExRx today with pt. Pt tolerated exercise well with an average MET level of 2.0. Pt is feeling good about her goals, she now knows her limits to exercise and is gaining strength and  MET's. Encouraged pt that adding exercise at home is a good way to increase strength and gain more heart strength. Pt will add in walking, and physical therapy exercise for her arms and legs that she enjoys doing for 1-2 days for 15-30 mins as tolerated with fatigue and with caring for her husband. Encouraged that as little as 10 min bouts of exercise could help so get in what she can. Reviewed MET's and goals and home ExRx. Pt tolerated exercise well with an average MET level of 2.3.Pt feels good about increasing strength and stamina and she is happy to be feeling good with exercise.     Expected Outcomes Will continue to monitor pt and progress workloads as tolerated without sign or symptom. Pt will continue to exercise on her own and gain strength. Will continue to monitor pt and progress workloads as tolerated without sign or symptom. Will continue to monitor pt and progress workloads as tolerated without sign or symptom.              Discharge Exercise Prescription (Final Exercise Prescription Changes):  Exercise Prescription Changes - 05/20/22 1642       Response to Exercise   Blood Pressure (Admit) 110/70    Blood Pressure (Exercise) 128/64    Blood Pressure (Exit) 112/66    Heart Rate (Admit) 58 bpm    Heart Rate (Exercise) 103 bpm    Heart Rate (Exit) 61 bpm    Rating of Perceived Exertion (Exercise) 11    Perceived Dyspnea (Exercise) 0    Symptoms none    Comments Reviewed MET's and goals    Duration Progress to 30 minutes of  aerobic without signs/symptoms of physical distress    Intensity THRR unchanged      Progression   Progression Continue to progress workloads to maintain intensity without signs/symptoms of physical distress.    Average METs 2.3      Resistance Training   Training Prescription No      Interval Training   Interval Training No      NuStep   Level 2    Minutes 30    METs 2.3      Home Exercise Plan   Plans to continue exercise at Home (comment)     Frequency Add 2 additional days to program exercise sessions.  Initial Home Exercises Provided 04/17/22             Nutrition:  Target Goals: Understanding of nutrition guidelines, daily intake of sodium '1500mg'$ , cholesterol '200mg'$ , calories 30% from fat and 7% or less from saturated fats, daily to have 5 or more servings of fruits and vegetables.  Biometrics:  Pre Biometrics - 03/24/22 1058       Pre Biometrics   Waist Circumference 36.75 inches    Hip Circumference 44.25 inches    Waist to Hip Ratio 0.83 %    Triceps Skinfold 34 mm    % Body Fat 43.7 %    Grip Strength 12 kg    Flexibility 11 in    Single Leg Stand 7.18 seconds              Nutrition Therapy Plan and Nutrition Goals:  Nutrition Therapy & Goals - 05/22/22 1157       Nutrition Therapy   Diet Heart Healthy Diet    Drug/Food Interactions Statins/Certain Fruits      Personal Nutrition Goals   Nutrition Goal Patient to identify strategies for managing cardiovascular risk by attending the weekly [facility] education and nutrition series    Personal Goal #2 Patient to identify food sources and limit daily intake of saturated fats, trans fat, sodium, and refined carbohydrates    Personal Goal #3 Patient to reduce sodium intake to '1500mg'$  per day    Personal Goal #4 Patient to improve diet quality by using the plate method as a daily guide for meal planning to include lean protein/plant protein, fruits, vegetables, whole grains, and nonfat dairy    Comments Victoria Ferguson continues to attend the [facility] education series regularly; she does report that her hearing has been a barrier to learning. She has a plan to get hearing aids. She has been slow to adopt nutrition changes and does choose fast food or convenience food often. 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 Most recent labs have not improved but worsened; her A1c is up to 6.5%, triglycerides 166, and AST 39. Victoria Ferguson will continue to benefit  from adherance to the [facility] eating plan and participation in intensive cardiac rehab for nutrition, exercise, and lifestyle modification.      Intervention Plan   Intervention Prescribe, educate and counsel regarding individualized specific dietary modifications aiming towards targeted core components such as weight, hypertension, lipid management, diabetes, heart failure and other comorbidities.;Nutrition handout(s) given to patient.    Expected Outcomes Short Term Goal: Understand basic principles of dietary content, such as calories, fat, sodium, cholesterol and nutrients.;Long Term Goal: Adherence to prescribed nutrition plan.             Nutrition Assessments:  MEDIFICTS Score Key: ?70 Need to make dietary changes  40-70 Heart Healthy Diet ? 40 Therapeutic Level Cholesterol Diet    Picture Your Plate Scores: <67 Unhealthy dietary pattern with much room for improvement. 41-50 Dietary pattern unlikely to meet recommendations for good health and room for improvement. 51-60 More healthful dietary pattern, with some room for improvement.  >60 Healthy dietary pattern, although there may be some specific behaviors that could be improved.    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 She reports take-out/fast food multiple times per week and PYP reflects over consuming saturated fats. Victoria Ferguson will benefit from adhereance to the [facility] eating plan to support LDL <70, A1c <7.0%, and improved diet quality. Victoria Ferguson does report her hearing is a barrier to learning but she is in  the process of getting hearing aids per notes 02/18/2022. Victoria Ferguson reports some low blood sugars; follow-up with PCP and recommended per nursing staff 12/8. Patient was educated on treating low blood sugars and educational resources were given at that time. She continues to choose take-out/fast food regularly and snack on refined carbohydrates/simple sugars in the evenings. She does attend the [facility] education and nutrition series regularly. Victoria Ferguson will continue to participate in intensive cardiac rehab for nutrition education, exercise, and lifestyle modification. Victoria Ferguson continues to attend the [facility] education series regularly; she does report that her hearing has been a barrier to learning. She has a plan to get hearing aids. She has been slow to adopt nutrition changes and does choose fast food or convenience food often. 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 Most recent labs have not improved but worsened; her A1c is up to 6.5%, triglycerides 166, and AST 39. Victoria Ferguson will continue to benefit from adherance to the [facility] eating plan and participation in intensive cardiac rehab for nutrition, exercise, and lifestyle modification.              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 She reports take-out/fast food multiple times per week and PYP reflects over consuming saturated fats. Victoria Ferguson will benefit from adhereance to the [facility] eating plan to support LDL <70, A1c <7.0%, and improved diet quality. Victoria Ferguson does report her hearing is a barrier to learning but she is in  the process of getting hearing aids per notes 02/18/2022. Victoria Ferguson reports some low blood sugars; follow-up with PCP and recommended per nursing staff 12/8. Patient was educated on treating low blood sugars and educational resources were given at that time. She continues to choose take-out/fast food regularly and snack on refined carbohydrates/simple sugars in the evenings. She does attend the [facility] education and nutrition series regularly. Victoria Ferguson will continue to participate in intensive cardiac rehab for nutrition education, exercise, and lifestyle modification. Victoria Ferguson continues to attend the [facility] education series regularly; she does report that her hearing has been a barrier to learning. She has a plan to get hearing aids. She has been slow to adopt nutrition changes and does choose fast food or convenience food often. 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 Most recent labs have not improved but worsened; her A1c is up to 6.5%, triglycerides 166, and AST 39. Victoria Ferguson will continue  to benefit from adherance to the [facility] eating plan and participation in intensive cardiac rehab for nutrition, exercise, and lifestyle modification.              Nutrition Goals Discharge (Final Nutrition Goals Re-Evaluation):  Nutrition Goals Re-Evaluation - 05/22/22 1157       Goals   Current Weight 152 lb 12.5 oz (69.3 kg)    Comment lipids WNL- triglycerides 166, AST 39, A1c 6.5    Expected Outcome Victoria Ferguson continues to attend the [employer] education series regularly; she does report that her hearing has been a barrier to learning. She has a plan to get hearing aids. She has been slow to adopt nutrition changes and does choose fast food or convenience food often. 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 Most recent labs have not improved but worsened; her A1c is up to 6.5%, triglycerides 166, and AST 39. Victoria Ferguson will continue to benefit from adherance to the [employer] eating plan and  participation in intensive cardiac rehab for nutrition, exercise, and lifestyle modification.             Psychosocial: Target Goals: Acknowledge presence or absence of significant depression and/or stress, maximize coping skills, provide positive support system. Participant is able to verbalize types and ability to use techniques and skills needed for reducing stress and depression.  Initial Review & Psychosocial Screening:  Initial Psych Review & Screening - 03/24/22 1218       Initial Review   Current issues with Current Stress Concerns;History of Depression    Source of Stress Concerns Family;Unable to participate in former interests or hobbies;Unable to perform yard/household activities    Comments Victoria Ferguson denies being depressed currenlty but says she has felt depressed at times as her husband has poor mobility one of Victoria Ferguson son's has dementia.      Family Dynamics   Good Support System? Yes   Victoria Ferguson has her husband and children for support     Barriers   Psychosocial barriers to participate in program The patient should benefit from training in stress management and relaxation.      Screening Interventions   Interventions Encouraged to exercise;To provide support and resources with identified psychosocial needs    Expected Outcomes Long Term Goal: Stressors or current issues are controlled or eliminated.             Quality of Life Scores:  Quality of Life - 03/24/22 1254       Quality of Life   Select Quality of Life      Quality of Life Scores   Health/Function Pre 25.2 %    Socioeconomic Pre 24.43 %    Psych/Spiritual Pre 20.57 %    Family Pre 24 %    GLOBAL Pre 23.91 %            Scores of 19 and below usually indicate a poorer quality of life in these areas.  A difference of  2-3 points is a clinically meaningful difference.  A difference of 2-3 points in the total score of the Quality of Life Index has been associated with significant improvement in overall  quality of life, self-image, physical symptoms, and general health in studies assessing change in quality of life.  PHQ-9: Review Flowsheet       03/24/2022  Depression screen PHQ 2/9  Decreased Interest 0  Down, Depressed, Hopeless 0  PHQ - 2 Score 0   Interpretation of Total Score  Total Score Depression  Severity:  1-4 = Minimal depression, 5-9 = Mild depression, 10-14 = Moderate depression, 15-19 = Moderately severe depression, 20-27 = Severe depression   Psychosocial Evaluation and Intervention:   Psychosocial Re-Evaluation:  Psychosocial Re-Evaluation     [place] Name 04/01/22 1606 04/28/22 1519 05/27/22 1638         Psychosocial Re-Evaluation   Current issues with Current Stress Concerns Current Stress Concerns Current Stress Concerns     Comments Victoria Ferguson cares for her husband who has health issues Victoria Ferguson cares for her husband who has health issues. Victoria Ferguson mentins that she worries about her husband who has limited mobility Victoria Ferguson has not voiced any increased concerns or stressors at intensive cardiac rehab     Interventions Encouraged to attend Cardiac Rehabilitation for the exercise Encouraged to attend Cardiac Rehabilitation for the exercise Encouraged to attend Cardiac Rehabilitation for the exercise     Continue Psychosocial Services  No Follow up required No Follow up required No Follow up required       Initial Review   Source of Stress Concerns Family Family Family     Comments will continue to monitor and offer support as needed will continue to monitor and offer support as needed will continue to monitor and offer support as needed              Psychosocial Discharge (Final Psychosocial Re-Evaluation):  Psychosocial Re-Evaluation - 05/27/22 1638       Psychosocial Re-Evaluation   Current issues with Current Stress Concerns    Comments Victoria Ferguson has not voiced any increased concerns or stressors at intensive cardiac rehab    Interventions Encouraged to attend Cardiac  Rehabilitation for the exercise    Continue Psychosocial Services  No Follow up required      Initial Review   Source of Stress Concerns Family    Comments will continue to monitor and offer support as needed             Vocational Rehabilitation: Provide vocational rehab assistance to qualifying candidates.   Vocational Rehab Evaluation & Intervention:  Vocational Rehab - 03/24/22 1221       Initial Vocational Rehab Evaluation & Intervention   Assessment shows need for Vocational Rehabilitation No   Victoria Ferguson is reitred and does not need vocational rehab at this time            Education: Education Goals: Education classes will be provided on a weekly basis, covering required topics. Participant will state understanding/return demonstration of topics presented.    Education     Row Name 04/01/22 1600     Education   Cardiac Education Topics [employer]   Educator Dietitian   Weekly Topic One-Pot Wonders   Instruction Review Code 1- Verbalizes Understanding   Class Start Time 1353   Class Stop Time 1437   Class Time Calculation (min) 44 min    [place] Name 04/03/22 1500     Education   Cardiac Education Topics Pritikin   Lexicographer Nutrition   Nutrition Dining Out - Part 1   Instruction Review Code 1- Verbalizes Understanding   Class Start Time 1355   Class Stop Time 1430   Class Time Calculation (min) 35 min    [place] Name 04/10/22 1500     Education   Cardiac Education Topics Pritikin   Administrator, sports  Core Videos   Educator Exercise Physiologist   Select Psychosocial   Psychosocial How Our Thoughts Can Heal Our Hearts   Instruction Review Code 1- Verbalizes Understanding   Class Start Time 1357   Class Stop Time 1440   Class Time Calculation (min) 43 min    [place] Name 04/15/22 1500     Education   Cardiac Education Topics Pritikin   Control and instrumentation engineer   Weekly Topic International Cuisine- Spotlight on the [place] Zones   Instruction Review Code 1- Verbalizes Understanding   Class Start Time 1355   Class Stop Time 1435   Class Time Calculation (min) 40 min    [place] Name 04/17/22 1500     Education   Cardiac Education Topics Pritikin   Select Workshops     Workshops   Educator Exercise Physiologist   Select Psychosocial   Psychosocial Workshop Recognizing and Reducing Stress   Instruction Review Code 1- Verbalizes Understanding   Class Start Time 1355   Class Stop Time 1445   Class Time Calculation (min) 50 min    [place] Name 04/22/22 1600     Education   Cardiac Education Topics Pritikin   Charity fundraiser Exercise Physiologist   Select Nutrition   Nutrition Cooking - Healthy Salads and Dressing   Instruction Review Code 1- Verbalizes Understanding   Class Start Time 1358   Class Stop Time 1442   Class Time Calculation (min) 44 min    [place] Name 04/24/22 1500     Education   Cardiac Education Topics Pritikin   Architect Education   General Education Heart Disease Risk Reduction   Instruction Review Code 1- Verbalizes Understanding   Class Start Time 1400   Class Stop Time 1440   Class Time Calculation (min) 40 min    [place] Name 04/29/22 1500     Education   Cardiac Education Topics Pritikin   Financial trader   Weekly Topic Fast and Healthy Breakfasts   Instruction Review Code 1- Verbalizes Understanding   Class Start Time 3491   Class Stop Time 1438   Class Time Calculation (min) 43 min    [place] Name 05/01/22 1500     Education   Cardiac Education Topics Pritikin   Lexicographer Nutrition   Nutrition Overview of the [place]   Instruction Review  Code 1- Verbalizes Understanding   Class Start Time 1400   Class Stop Time 1440   Class Time Calculation (min) 40 min    [place] Name 05/06/22 1600     Education   Cardiac Education Topics [employer]   Educator Dietitian   Weekly Topic Personalizing Your Pritikin Plate   Instruction Review Code 1- Verbalizes Understanding   Class Start Time 1403   Class Stop Time 1458   Class Time Calculation (min) 55 min    [place] Name 05/08/22 1400     Education   Cardiac Education Topics Pritikin   Academic librarian Psychosocial   Psychosocial Healthy Minds,  Bodies, Hearts   Instruction Review Code 1- Verbalizes Understanding   Class Start Time 1358   Class Stop Time 1432   Class Time Calculation (min) 34 min    [place] Name 05/14/22 1000     Education   Cardiac Education Topics Pritikin   Pharmacist, hospital School  completed 05/13/22     Cooking School   Educator Dietitian   Weekly Topic Delicious Desserts   Instruction Review Code 1- Information systems manager   Class Start Time 1400   Class Stop Time 1458   Class Time Calculation (min) 58 min    [place] Name 05/15/22 1500     Education   Cardiac Education Topics Pritikin   Scientist, research (life sciences)   Educator Dietitian   Nutrition Other  Label Reading   Instruction Review Code 1- Verbalizes Understanding   Class Start Time 1400   Class Stop Time 1448   Class Time Calculation (min) 48 min    [place] Name 05/20/22 1600     Education   Cardiac Education Topics Pritikin   Financial trader   Weekly Topic Tasty Appetizers and Snacks   Instruction Review Code 1- Verbalizes Understanding   Class Start Time 1402   Class Stop Time 1448   Class Time Calculation (min) 46 min    [place] Name 05/22/22 1500     Education   Cardiac Education Topics Pritikin   Engineer, agricultural   Educator Dietitian   Nutrition Calorie Density   Instruction Review Code 1- Verbalizes Understanding   Class Start Time 1400   Class Stop Time 1442   Class Time Calculation (min) 42 min    [place] Name 05/27/22 1600     Education   Cardiac Education Topics Pritikin   Financial trader   Weekly Topic Efficiency Cooking - Meals in a Snap   Instruction Review Code 1- Verbalizes Understanding   Class Start Time 1355   Class Stop Time 1437   Class Time Calculation (min) 42 min            Core Videos: Exercise    Move It!  Clinical staff conducted group or individual video education with verbal and written material and guidebook.  Patient learns the recommended Pritikin exercise program. Exercise with the goal of living a long, healthy life. Some of the health benefits of exercise include controlled diabetes, healthier blood pressure levels, improved cholesterol levels, improved heart and lung capacity, improved sleep, and better body composition. Everyone should speak with their doctor before starting or changing an exercise routine.  Biomechanical Limitations Clinical staff conducted group or individual video education with verbal and written material and guidebook.  Patient learns how biomechanical limitations can impact exercise and how we can mitigate and possibly overcome limitations to have an impactful and balanced exercise routine.  Body Composition Clinical staff conducted group or individual video education with verbal and written material and guidebook.  Patient learns that body composition (ratio of muscle mass to fat mass) is a key component to assessing overall fitness, rather than body weight alone. Increased fat mass, especially visceral belly fat, can put [place] at increased risk for metabolic syndrome, type 2 diabetes, heart disease, and even death. It is recommended to combine diet and exercise (cardiovascular and  resistance training) to improve your body composition.  Seek guidance from your physician and exercise physiologist before implementing an exercise routine.  Exercise Action Plan Clinical staff conducted group or individual video education with verbal and written material and guidebook.  Patient learns the recommended strategies to achieve and enjoy long-term exercise adherence, including variety, self-motivation, self-efficacy, and positive decision making. Benefits of exercise include fitness, good health, weight management, more energy, better sleep, less stress, and overall well-being.  Medical   Heart Disease Risk Reduction Clinical staff conducted group or individual video education with verbal and written material and guidebook.  Patient learns our heart is our most vital organ as it circulates oxygen, nutrients, white blood cells, and hormones throughout the entire body, and carries waste away. Data supports a plant-based eating plan like the Pritikin Program for its effectiveness in slowing progression of and reversing heart disease. The video provides a number of recommendations to address heart disease.   Metabolic Syndrome and Belly Fat  Clinical staff conducted group or individual video education with verbal and written material and guidebook.  Patient learns what metabolic syndrome is, how it leads to heart disease, and how one can reverse it and keep it from coming back. You have metabolic syndrome if you have 3 of the following 5 criteria: abdominal obesity, high blood pressure, high triglycerides, low HDL cholesterol, and high blood sugar.  Hypertension and Heart Disease Clinical staff conducted group or individual video education with verbal and written material and guidebook.  Patient learns that high blood pressure, or hypertension, is very common in the [place]. Hypertension is largely due to excessive salt intake, but other important risk factors include being overweight,  physical inactivity, drinking too much alcohol, smoking, and not eating enough potassium from fruits and vegetables. High blood pressure is a leading risk factor for heart attack, stroke, congestive heart failure, dementia, kidney failure, and premature death. Long-term effects of excessive salt intake include stiffening of the arteries and thickening of heart muscle and organ damage. Recommendations include ways to reduce hypertension and the risk of heart disease.  Diseases of Our Time - Focusing on Diabetes Clinical staff conducted group or individual video education with verbal and written material and guidebook.  Patient learns why the best way to stop diseases of our time is prevention, through food and other lifestyle changes. Medicine (such as prescription pills and surgeries) is often only a Band-Aid on the problem, not a long-term solution. Most common diseases of our time include obesity, type 2 diabetes, hypertension, heart disease, and cancer. The Pritikin Program is recommended and has been proven to help reduce, reverse, and/or prevent the damaging effects of metabolic syndrome.  Nutrition   Overview of the Pritikin Eating Plan  Clinical staff conducted group or individual video education with verbal and written material and guidebook.  Patient learns about the [place] for disease risk reduction. The [place] emphasizes a wide variety of unrefined, minimally-processed carbohydrates, like fruits, vegetables, whole grains, and legumes. Go, Caution, and Stop food choices are explained. Plant-based and lean animal proteins are emphasized. Rationale provided for low sodium intake for blood pressure control, low added sugars for blood sugar stabilization, and low added fats and oils for coronary artery disease risk reduction and weight management.  Calorie Density  Clinical staff conducted group or individual video education with verbal and written material and  guidebook.  Patient learns about calorie density and how it impacts the Pritikin Eating Plan. Knowing the characteristics of the food you choose will help you  decide whether those foods will lead to weight gain or weight loss, and whether you want to consume more or less of them. Weight loss is usually a side effect of the Pritikin Eating Plan because of its focus on low calorie-dense foods.  Label Reading  Clinical staff conducted group or individual video education with verbal and written material and guidebook.  Patient learns about the Pritikin recommended label reading guidelines and corresponding recommendations regarding calorie density, added sugars, sodium content, and whole grains.  Dining Out - Part 1  Clinical staff conducted group or individual video education with verbal and written material and guidebook.  Patient learns that restaurant meals can be sabotaging because they can be so high in calories, fat, sodium, and/or sugar. Patient learns recommended strategies on how to positively address this and avoid unhealthy pitfalls.  Facts on Fats  Clinical staff conducted group or individual video education with verbal and written material and guidebook.  Patient learns that lifestyle modifications can be just as effective, if not more so, as many medications for lowering your risk of heart disease. A Pritikin lifestyle can help to reduce your risk of inflammation and atherosclerosis (cholesterol build-up, or plaque, in the artery walls). Lifestyle interventions such as dietary choices and physical activity address the cause of atherosclerosis. A review of the types of fats and their impact on blood cholesterol levels, along with dietary recommendations to reduce fat intake is also included.  Nutrition Action Plan  Clinical staff conducted group or individual video education with verbal and written material and guidebook.  Patient learns how to incorporate Pritikin recommendations into  their lifestyle. Recommendations include planning and keeping personal health goals in mind as an important part of their success.  Healthy Mind-Set    Healthy Minds, Bodies, Hearts  Clinical staff conducted group or individual video education with verbal and written material and guidebook.  Patient learns how to identify when they are stressed. Video will discuss the impact of that stress, as well as the many benefits of stress management. Patient will also be introduced to stress management techniques. The way we think, act, and feel has an impact on our hearts.  How Our Thoughts Can Heal Our Hearts  Clinical staff conducted group or individual video education with verbal and written material and guidebook.  Patient learns that negative thoughts can cause depression and anxiety. This can result in negative lifestyle behavior and serious health problems. Cognitive behavioral therapy is an effective method to help control our thoughts in order to change and improve our emotional outlook.  Additional Videos:  Exercise    Improving Performance  Clinical staff conducted group or individual video education with verbal and written material and guidebook.  Patient learns to use a non-linear approach by alternating intensity levels and lengths of time spent exercising to help burn more calories and lose more body fat. Cardiovascular exercise helps improve heart health, metabolism, hormonal balance, blood sugar control, and recovery from fatigue. Resistance training improves strength, endurance, balance, coordination, reaction time, metabolism, and muscle mass. Flexibility exercise improves circulation, posture, and balance. Seek guidance from your physician and exercise physiologist before implementing an exercise routine and learn your capabilities and proper form for all exercise.  Introduction to Yoga  Clinical staff conducted group or individual video education with verbal and written material and  guidebook.  Patient learns about yoga, a discipline of the coming together of mind, breath, and body. The benefits of yoga include improved flexibility, improved range of motion, better  posture and core strength, increased lung function, weight loss, and positive self-image. Yoga's heart health benefits include lowered blood pressure, healthier heart rate, decreased cholesterol and triglyceride levels, improved immune function, and reduced stress. Seek guidance from your physician and exercise physiologist before implementing an exercise routine and learn your capabilities and proper form for all exercise.  Medical   Aging: Enhancing Your Quality of Life  Clinical staff conducted group or individual video education with verbal and written material and guidebook.  Patient learns key strategies and recommendations to stay in good physical health and enhance quality of life, such as prevention strategies, having an advocate, securing a [place], and keeping a list of medications and system for tracking them. It also discusses how to avoid risk for bone loss.  Biology of Weight Control  Clinical staff conducted group or individual video education with verbal and written material and guidebook.  Patient learns that weight gain occurs because we consume more calories than we burn (eating more, moving less). Even if your body weight is normal, you may have higher ratios of fat compared to muscle mass. Too much body fat puts you at increased risk for cardiovascular disease, heart attack, stroke, type 2 diabetes, and obesity-related cancers. In addition to exercise, following the [place] can help reduce your risk.  Decoding Lab Results  Clinical staff conducted group or individual video education with verbal and written material and guidebook.  Patient learns that lab test reflects one measurement whose values change over time and are influenced by many factors,  including medication, stress, sleep, exercise, food, hydration, pre-existing medical conditions, and more. It is recommended to use the knowledge from this video to become more involved with your lab results and evaluate your numbers to speak with your doctor.   Diseases of Our Time - Overview  Clinical staff conducted group or individual video education with verbal and written material and guidebook.  Patient learns that according to the CDC, 50% to 70% of chronic diseases (such as obesity, type 2 diabetes, elevated lipids, hypertension, and heart disease) are avoidable through lifestyle improvements including healthier food choices, listening to satiety cues, and increased physical activity.  Sleep Disorders Clinical staff conducted group or individual video education with verbal and written material and guidebook.  Patient learns how good quality and duration of sleep are important to overall health and well-being. Patient also learns about sleep disorders and how they impact health along with recommendations to address them, including discussing with a physician.  Nutrition  Dining Out - Part 2 Clinical staff conducted group or individual video education with verbal and written material and guidebook.  Patient learns how to plan ahead and communicate in order to maximize their dining experience in a healthy and nutritious manner. Included are recommended food choices based on the type of restaurant the patient is visiting.   Fueling a Best boy conducted group or individual video education with verbal and written material and guidebook.  There is a strong connection between our food choices and our health. Diseases like obesity and type 2 diabetes are very prevalent and are in large-part due to lifestyle choices. The Pritikin Eating Plan provides plenty of food and hunger-curbing satisfaction. It is easy to follow, affordable, and helps reduce health risks.  Menu Workshop   Clinical staff conducted group or individual video education with verbal and written material and guidebook.  Patient learns that restaurant meals can sabotage health goals  because they are often packed with calories, fat, sodium, and sugar. Recommendations include strategies to plan ahead and to communicate with the manager, chef, or server to help order a healthier meal.  Planning Your Eating Strategy  Clinical staff conducted group or individual video education with verbal and written material and guidebook.  Patient learns about the [place] and its benefit of reducing the risk of disease. The [place] does not focus on calories. Instead, it emphasizes high-quality, nutrient-rich foods. By knowing the characteristics of the foods, we choose, we can determine their calorie density and make informed decisions.  Targeting Your Nutrition Priorities  Clinical staff conducted group or individual video education with verbal and written material and guidebook.  Patient learns that lifestyle habits have a tremendous impact on disease risk and progression. This video provides eating and physical activity recommendations based on your personal health goals, such as reducing LDL cholesterol, losing weight, preventing or controlling type 2 diabetes, and reducing high blood pressure.  Vitamins and Minerals  Clinical staff conducted group or individual video education with verbal and written material and guidebook.  Patient learns different ways to obtain key vitamins and minerals, including through a recommended healthy diet. It is important to discuss all supplements you take with your doctor.   Healthy Mind-Set    Smoking Cessation  Clinical staff conducted group or individual video education with verbal and written material and guidebook.  Patient learns that cigarette smoking and tobacco addiction pose a serious health risk which affects millions of people. Stopping smoking  will significantly reduce the risk of heart disease, lung disease, and many forms of cancer. Recommended strategies for quitting are covered, including working with your doctor to develop a successful plan.  Culinary   Becoming a Financial trader conducted group or individual video education with verbal and written material and guidebook.  Patient learns that cooking at home can be healthy, cost-effective, quick, and puts them in control. Keys to cooking healthy recipes will include looking at your recipe, assessing your equipment needs, planning ahead, making it simple, choosing cost-effective seasonal ingredients, and limiting the use of added fats, salts, and sugars.  Cooking - Breakfast and Snacks  Clinical staff conducted group or individual video education with verbal and written material and guidebook.  Patient learns how important breakfast is to satiety and nutrition through the entire day. Recommendations include key foods to eat during breakfast to help stabilize blood sugar levels and to prevent overeating at meals later in the day. Planning ahead is also a key component.  Cooking - Human resources officer conducted group or individual video education with verbal and written material and guidebook.  Patient learns eating strategies to improve overall health, including an approach to cook more at home. Recommendations include thinking of animal protein as a side on your plate rather than center stage and focusing instead on lower calorie dense options like vegetables, fruits, whole grains, and plant-based proteins, such as beans. Making sauces in large quantities to freeze for later and leaving the skin on your vegetables are also recommended to maximize your experience.  Cooking - Healthy Salads and Dressing Clinical staff conducted group or individual video education with verbal and written material and guidebook.  Patient learns that vegetables, fruits, whole  grains, and legumes are the foundations of the [place]. Recommendations include how to incorporate each of these in flavorful and healthy salads, and how to create homemade salad dressings.  Proper handling of ingredients is also covered. Cooking - Soups and [employer] - Soups and Desserts Clinical staff conducted group or individual video education with verbal and written material and guidebook.  Patient learns that Pritikin soups and desserts make for easy, nutritious, and delicious snacks and meal components that are low in sodium, fat, sugar, and calorie density, while high in vitamins, minerals, and filling fiber. Recommendations include simple and healthy ideas for soups and desserts.   Overview     The Pritikin Solution Program Overview Clinical staff conducted group or individual video education with verbal and written material and guidebook.  Patient learns that the results of the [place] Program have been documented in more than 100 articles published in peer-reviewed journals, and the benefits include reducing risk factors for (and, in some cases, even reversing) high cholesterol, high blood pressure, type 2 diabetes, obesity, and more! An overview of the three key pillars of the Pritikin Program will be covered: eating well, doing regular exercise, and having a healthy mind-set.  WORKSHOPS  Exercise: Exercise Basics: Building Your Action Plan Clinical staff led group instruction and group discussion with PowerPoint presentation and patient guidebook. To enhance the learning environment the use of posters, models and videos may be added. At the conclusion of this workshop, patients will comprehend the difference between physical activity and exercise, as well as the benefits of incorporating both, into their routine. Patients will understand the FITT (Frequency, Intensity, Time, and Type) principle and how to use it to build an exercise action plan. In addition,  safety concerns and other considerations for exercise and cardiac rehab will be addressed by the presenter. The purpose of this lesson is to promote a comprehensive and effective weekly exercise routine in order to improve patients' overall level of fitness.   Managing Heart Disease: Your Path to a Healthier Heart Clinical staff led group instruction and group discussion with PowerPoint presentation and patient guidebook. To enhance the learning environment the use of posters, models and videos may be added.At the conclusion of this workshop, patients will understand the anatomy and physiology of the heart. Additionally, they will understand how Pritikin's three pillars impact the risk factors, the progression, and the management of heart disease.  The purpose of this lesson is to provide a high-level overview of the heart, heart disease, and how the Pritikin lifestyle positively impacts risk factors.  Exercise Biomechanics Clinical staff led group instruction and group discussion with PowerPoint presentation and patient guidebook. To enhance the learning environment the use of posters, models and videos may be added. Patients will learn how the structural parts of their bodies function and how these functions impact their daily activities, movement, and exercise. Patients will learn how to promote a neutral spine, learn how to manage pain, and identify ways to improve their physical movement in order to promote healthy living. The purpose of this lesson is to expose patients to common physical limitations that impact physical activity. Participants will learn practical ways to adapt and manage aches and pains, and to minimize their effect on regular exercise. Patients will learn how to maintain good posture while sitting, walking, and lifting.  Balance Training and Fall Prevention  Clinical staff led group instruction and group discussion with PowerPoint presentation and patient guidebook. To  enhance the learning environment the use of posters, models and videos may be added. At the conclusion of this workshop, patients will understand the importance of their sensorimotor skills (vision, proprioception, and the vestibular system) in  maintaining their ability to balance as they age. Patients will apply a variety of balancing exercises that are appropriate for their current level of function. Patients will understand the common causes for poor balance, possible solutions to these problems, and ways to modify their physical environment in order to minimize their fall risk. The purpose of this lesson is to teach patients about the importance of maintaining balance as they age and ways to minimize their risk of falling.  WORKSHOPS   Nutrition:  Fueling a Scientist, research (physical sciences) led group instruction and group discussion with PowerPoint presentation and patient guidebook. To enhance the learning environment the use of posters, models and videos may be added. Patients will review the foundational principles of the [place] and understand what constitutes a serving size in each of the food groups. Patients will also learn Pritikin-friendly foods that are better choices when away from home and review make-ahead meal and snack options. Calorie density will be reviewed and applied to three nutrition priorities: weight maintenance, weight loss, and weight gain. The purpose of this lesson is to reinforce (in a group setting) the key concepts around what patients are recommended to eat and how to apply these guidelines when away from home by planning and selecting Pritikin-friendly options. Patients will understand how calorie density may be adjusted for different weight management goals.  Mindful Eating  Clinical staff led group instruction and group discussion with PowerPoint presentation and patient guidebook. To enhance the learning environment the use of posters, models and videos may  be added. Patients will briefly review the concepts of the [place] and the importance of low-calorie dense foods. The concept of mindful eating will be introduced as well as the importance of paying attention to internal hunger signals. Triggers for non-hunger eating and techniques for dealing with triggers will be explored. The purpose of this lesson is to provide patients with the opportunity to review the basic principles of the [place], discuss the value of eating mindfully and how to measure internal cues of hunger and fullness using the Hunger Scale. Patients will also discuss reasons for non-hunger eating and learn strategies to use for controlling emotional eating.  Targeting Your Nutrition Priorities Clinical staff led group instruction and group discussion with PowerPoint presentation and patient guidebook. To enhance the learning environment the use of posters, models and videos may be added. Patients will learn how to determine their genetic susceptibility to disease by reviewing their family history. Patients will gain insight into the importance of diet as part of an overall healthy lifestyle in mitigating the impact of genetics and other environmental insults. The purpose of this lesson is to provide patients with the opportunity to assess their personal nutrition priorities by looking at their family history, their own health history and current risk factors. Patients will also be able to discuss ways of prioritizing and modifying the [place] for their highest risk areas  Menu  Clinical staff led group instruction and group discussion with PowerPoint presentation and patient guidebook. To enhance the learning environment the use of posters, models and videos may be added. Using menus brought in from [employer], or printed from [employer], patients will apply the [place] dining out guidelines that were presented in the [employer] video. Patients will also be able to practice these guidelines in a variety of provided scenarios. The purpose of this lesson is to provide patients with the opportunity to practice hands-on learning  of the [employer] guidelines with actual menus and practice scenarios.  Label Reading Clinical staff led group instruction and group discussion with PowerPoint presentation and patient guidebook. To enhance the learning environment the use of posters, models and videos may be added. Patients will review and discuss the Pritikin label reading guidelines presented in Pritikin's Label Reading Educational series video. Using fool labels brought in from local grocery stores and markets, patients will apply the label reading guidelines and determine if the packaged food meet the Pritikin guidelines. The purpose of this lesson is to provide patients with the opportunity to review, discuss, and practice hands-on learning of the Pritikin Label Reading guidelines with actual packaged food labels. [place] Workshops are designed to teach patients ways to prepare quick, simple, and affordable recipes at home. The importance of nutrition's role in chronic disease risk reduction is reflected in its emphasis in the overall Pritikin program. By learning how to prepare essential core Pritikin Eating Plan recipes, patients will increase control over what they eat; be able to customize the flavor of foods without the use of added salt, sugar, or fat; and improve the quality of the food they consume. By learning a set of core recipes which are easily assembled, quickly prepared, and affordable, patients are more likely to prepare more healthy foods at home. These workshops focus on convenient breakfasts, simple entres, side dishes, and desserts which can be prepared with minimal effort and are consistent with nutrition recommendations for cardiovascular risk reduction. Cooking  [employer] are taught by a Engineer, materials (RD) who has been trained by the [employer]. The chef or RD has a clear understanding of the importance of minimizing - if not completely eliminating - added fat, sugar, and sodium in recipes. Throughout the series of [place] Workshop sessions, patients will learn about healthy ingredients and efficient methods of cooking to build confidence in their capability to prepare    Cooking School weekly topics:  Adding Flavor- Sodium-Free  Fast and Healthy Breakfasts  Powerhouse Plant-Based Proteins  Satisfying Salads and Dressings  Simple Sides and Sauces  International Cuisine-Spotlight on the [place] Zones  Delicious Desserts  Savory Soups  Efficiency Cooking - Meals in a Snap  Tasty Appetizers and Snacks  Comforting Weekend Breakfasts  One-Pot Wonders   Fast Evening Meals  Easy [place] (Psychosocial): New Thoughts, New Behaviors Clinical staff led group instruction and group discussion with PowerPoint presentation and patient guidebook. To enhance the learning environment the use of posters, models and videos may be added. Patients will learn and practice techniques for developing effective health and lifestyle goals. Patients will be able to effectively apply the goal setting process learned to develop at least one new personal goal.  The purpose of this lesson is to expose patients to a new skill set of behavior modification techniques such as techniques setting SMART goals, overcoming barriers, and achieving new thoughts and new behaviors.  Managing Moods and Relationships Clinical staff led group instruction and group discussion with PowerPoint presentation and patient guidebook. To enhance the learning environment the use of posters, models and videos may be added. Patients will learn how emotional and chronic stress factors can impact  their health and relationships. They will learn healthy ways to manage their moods and utilize positive coping mechanisms. In addition, ICR patients will learn ways to improve communication skills. The purpose of this  lesson is to expose patients to ways of understanding how one's mood and health are intimately connected. Developing a healthy outlook can help build positive relationships and connections with others. Patients will understand the importance of utilizing effective communication skills that include actively listening and being heard. They will learn and understand the importance of the "4 Cs" and especially Connections in fostering of a Healthy Mind-Set.  Healthy Sleep for a Healthy Heart Clinical staff led group instruction and group discussion with PowerPoint presentation and patient guidebook. To enhance the learning environment the use of posters, models and videos may be added. At the conclusion of this workshop, patients will be able to demonstrate knowledge of the importance of sleep to overall health, well-being, and quality of life. They will understand the symptoms of, and treatments for, common sleep disorders. Patients will also be able to identify daytime and nighttime behaviors which impact sleep, and they will be able to apply these tools to help manage sleep-related challenges. The purpose of this lesson is to provide patients with a general overview of sleep and outline the importance of quality sleep. Patients will learn about a few of the most common sleep disorders. Patients will also be introduced to the concept of "sleep hygiene," and discover ways to self-manage certain sleeping problems through simple daily behavior changes. Finally, the workshop will motivate patients by clarifying the links between quality sleep and their goals of heart-healthy living.   Recognizing and Reducing Stress Clinical staff led group instruction and group discussion with PowerPoint presentation  and patient guidebook. To enhance the learning environment the use of posters, models and videos may be added. At the conclusion of this workshop, patients will be able to understand the types of stress reactions, differentiate between acute and chronic stress, and recognize the impact that chronic stress has on their health. They will also be able to apply different coping mechanisms, such as reframing negative self-talk. Patients will have the opportunity to practice a variety of stress management techniques, such as deep abdominal breathing, progressive muscle relaxation, and/or guided imagery.  The purpose of this lesson is to educate patients on the role of stress in their lives and to provide healthy techniques for coping with it.  Learning Barriers/Preferences:  Learning Barriers/Preferences - 03/24/22 1220       Learning Barriers/Preferences   Learning Barriers Exercise Concerns;Sight;Hearing   Wears reading glasses reports feeling dizzy at times. Hard of hearing does not have hearing aides.   Learning Preferences Written Material;Pictoral             Education Topics:  Knowledge Questionnaire Score:  Knowledge Questionnaire Score - 03/24/22 1255       Knowledge Questionnaire Score   Pre Score 18/24             Core Components/Risk Factors/Patient Goals at Admission:  Personal Goals and Risk Factors at Admission - 03/24/22 1255       Core Components/Risk Factors/Patient Goals on Admission    Weight Management Yes;Obesity;Weight Loss    Intervention Weight Management/Obesity: Establish reasonable short term and long term weight goals.;Obesity: Provide education and appropriate resources to help participant work on and attain dietary goals.    Admit Weight 151 lb 7.3 oz (68.7 kg)    Expected Outcomes Short Term: Continue to assess and modify interventions until short term weight is achieved;Long Term: Adherence to nutrition and physical activity/exercise program aimed  toward attainment of established weight goal;Weight Loss: Understanding of general recommendations for a balanced deficit  meal plan, which promotes 1-2 lb weight loss per week and includes a negative energy balance of [phone] kcal/d    Diabetes Yes    Intervention Provide education about signs/symptoms and action to take for hypo/hyperglycemia.;Provide education about proper nutrition, including hydration, and aerobic/resistive exercise prescription along with prescribed medications to achieve blood glucose in normal ranges: Fasting glucose 65-99 mg/dL    Expected Outcomes Short Term: Participant verbalizes understanding of the signs/symptoms and immediate care of hyper/hypoglycemia, proper foot care and importance of medication, aerobic/resistive exercise and nutrition plan for blood glucose control.;Long Term: Attainment of HbA1C < 7%.    Hypertension Yes    Intervention Provide education on lifestyle modifcations including regular physical activity/exercise, weight management, moderate sodium restriction and increased consumption of fresh fruit, vegetables, and low fat dairy, alcohol moderation, and smoking cessation.;Monitor prescription use compliance.    Expected Outcomes Short Term: Continued assessment and intervention until BP is < 140/61m HG in hypertensive participants. < 130/861mHG in hypertensive participants with diabetes, heart failure or chronic kidney disease.;Long Term: Maintenance of blood pressure at goal levels.    Lipids Yes    Intervention Provide education and support for participant on nutrition & aerobic/resistive exercise along with prescribed medications to achieve LDL '70mg'$ , HDL >'40mg'$ .    Expected Outcomes Short Term: Participant states understanding of desired cholesterol values and is compliant with medications prescribed. Participant is following exercise prescription and nutrition guidelines.;Long Term: Cholesterol controlled with medications as prescribed, with  individualized exercise RX and with personalized nutrition plan. Value goals: LDL < '70mg'$ , HDL > 40 mg.    Personal Goal Other Yes    Personal Goal Start exercise program. Know how much she can lift. Decrease risk for a heart attack.    Intervention Provide education on heart healthy eating, healthy mindset, and individualized exercise program including aerobic, resistance, and stretching routine to decrease risk for future cardiac event.    Expected Outcomes Patient will understand parameters for exercise and safe lifting. Patient will make healthy lifestyle changes to decrease risk for future cardiac event/heart attack.             Core Components/Risk Factors/Patient Goals Review:   Goals and Risk Factor Review     Row Name 04/01/22 1610 04/28/22 1521 05/27/22 1640         Core Components/Risk Factors/Patient Goals Review   Personal Goals Review Weight Management/Obesity;Hypertension;Diabetes;Lipids;Stress Weight Management/Obesity;Hypertension;Diabetes;Lipids;Stress Weight Management/Obesity;Hypertension;Diabetes;Lipids;Stress     Review LoYork Cerisetarted intensive cardiac rehab on 04/01/22 and did well with exercise. Vital signs and CBG's were stable LoYork Ceriseontinues to do well with exercise at intensive cardiac rehab. Vital signs and CBG's were stable LoYork Ceriseontinues to do well with exercise at intensive cardiac rehab. Vital signs and CBG's were stable     Expected Outcomes LoYork Ceriseill continue to participate in intensive cardiac rehab for exercise, nutrition and lifestyle modifications LoYork Ceriseill continue to participate in intensive cardiac rehab for exercise, nutrition and lifestyle modifications LoYork Ceriseill continue to participate in intensive cardiac rehab for exercise, nutrition and lifestyle modifications              Core Components/Risk Factors/Patient Goals at Discharge (Final Review):   Goals and Risk Factor Review - 05/27/22 1640       Core Components/Risk Factors/Patient Goals Review    Personal Goals Review Weight Management/Obesity;Hypertension;Diabetes;Lipids;Stress    Review LoYork Ceriseontinues to do well with exercise at intensive cardiac rehab. Vital signs and CBG's were stable    Expected Outcomes LoYork Ferguson  will continue to participate in intensive cardiac rehab for exercise, nutrition and lifestyle modifications             ITP Comments:  ITP Comments     Row Name 03/24/22 1058 04/01/22 1414 04/28/22 1517 05/27/22 1638     ITP Comments Medical Director- Dr. Fransico Him, MD, Introduction to Pritikin Education Program/ Intensive Cardiac Rehab Orientatin Packet reviewed with the patient 30 Day ITP Review. Caelin started intensive cardiac rehab on 04/01/22 amd did well with exercise. 30 Day ITP Review. Kaydan has good attendance and participation in  intensive cardiac rehab on 04/01/22. 30 Day ITP Review. Gaytha has good attendance and participation in  intensive cardiac rehab             Comments: See ITP comments.Harrell Gave RN BSN

## 2022-05-29 ENCOUNTER — Encounter (HOSPITAL_COMMUNITY)
Admission: RE | Admit: 2022-05-29 | Discharge: 2022-05-29 | Disposition: A | Discharge status: Home / Self Care | Payer: Medicare Other | Source: Ambulatory Visit | Attending: Cardiology | Admitting: Cardiology

## 2022-05-29 ENCOUNTER — Encounter (HOSPITAL_COMMUNITY): Admission: RE | Admit: 2022-05-29 | Payer: Medicare Other | Source: Ambulatory Visit

## 2022-05-29 DIAGNOSIS — Z5189 Encounter for other specified aftercare: Secondary | ICD-10-CM | POA: Diagnosis not present

## 2022-05-29 DIAGNOSIS — I2089 Other forms of angina pectoris: Secondary | ICD-10-CM

## 2022-06-02 ENCOUNTER — Inpatient Hospital Stay: Payer: Medicare Other | Attending: Hematology & Oncology

## 2022-06-02 ENCOUNTER — Inpatient Hospital Stay (HOSPITAL_BASED_OUTPATIENT_CLINIC_OR_DEPARTMENT_OTHER): Payer: Medicare Other | Admitting: Medical Oncology

## 2022-06-02 ENCOUNTER — Encounter: Payer: Self-pay | Admitting: Medical Oncology

## 2022-06-02 VITALS — BP 118/60 | HR 66 | Temp 98.2°F | Resp 19 | Ht 59.0 in | Wt 151.0 lb

## 2022-06-02 DIAGNOSIS — Z79899 Other long term (current) drug therapy: Secondary | ICD-10-CM | POA: Diagnosis not present

## 2022-06-02 DIAGNOSIS — Z887 Allergy status to serum and vaccine status: Secondary | ICD-10-CM | POA: Insufficient documentation

## 2022-06-02 DIAGNOSIS — D696 Thrombocytopenia, unspecified: Secondary | ICD-10-CM | POA: Insufficient documentation

## 2022-06-02 DIAGNOSIS — I10 Essential (primary) hypertension: Secondary | ICD-10-CM | POA: Diagnosis not present

## 2022-06-02 DIAGNOSIS — Z888 Allergy status to other drugs, medicaments and biological substances status: Secondary | ICD-10-CM | POA: Insufficient documentation

## 2022-06-02 DIAGNOSIS — E039 Hypothyroidism, unspecified: Secondary | ICD-10-CM | POA: Insufficient documentation

## 2022-06-02 DIAGNOSIS — E785 Hyperlipidemia, unspecified: Secondary | ICD-10-CM | POA: Insufficient documentation

## 2022-06-02 DIAGNOSIS — E119 Type 2 diabetes mellitus without complications: Secondary | ICD-10-CM | POA: Diagnosis not present

## 2022-06-02 LAB — CMP (CANCER CENTER ONLY)
ALT: 21 U/L (ref 0–44)
AST: 39 U/L (ref 15–41)
Albumin: 3.9 g/dL (ref 3.5–5.0)
Alkaline Phosphatase: 52 U/L (ref 38–126)
Anion gap: 9 (ref 5–15)
BUN: 27 mg/dL — ABNORMAL HIGH (ref 8–23)
CO2: 27 mmol/L (ref 22–32)
Calcium: 10.2 mg/dL (ref 8.9–10.3)
Chloride: 102 mmol/L (ref 98–111)
Creatinine: 1.35 mg/dL — ABNORMAL HIGH (ref 0.44–1.00)
GFR, Estimated: 40 mL/min — ABNORMAL LOW (ref >60)
Glucose, Bld: 123 mg/dL — ABNORMAL HIGH (ref 70–99)
Potassium: 3.6 mmol/L (ref 3.5–5.1)
Sodium: 138 mmol/L (ref 135–145)
Total Bilirubin: 1.1 mg/dL (ref 0.3–1.2)
Total Protein: 7.2 g/dL (ref 6.5–8.1)

## 2022-06-02 LAB — CBC WITH DIFFERENTIAL/PLATELET
Abs Immature Granulocytes: 0.02 10*3/uL (ref 0.00–0.07)
Basophils Absolute: 0 10*3/uL (ref 0.0–0.1)
Basophils Relative: 0 %
Eosinophils Absolute: 0.1 10*3/uL (ref 0.0–0.5)
Eosinophils Relative: 1 %
HCT: 37.7 % (ref 36.0–46.0)
Hemoglobin: 12.3 g/dL (ref 12.0–15.0)
Immature Granulocytes: 0 %
Lymphocytes Relative: 20 %
Lymphs Abs: 1 10*3/uL (ref 0.7–4.0)
MCH: 28 pg (ref 26.0–34.0)
MCHC: 32.6 g/dL (ref 30.0–36.0)
MCV: 85.9 fL (ref 80.0–100.0)
Monocytes Absolute: 0.4 10*3/uL (ref 0.1–1.0)
Monocytes Relative: 9 %
Neutro Abs: 3.5 10*3/uL (ref 1.7–7.7)
Neutrophils Relative %: 70 %
Platelets: 99 10*3/uL — ABNORMAL LOW (ref 150–400)
RBC: 4.39 MIL/uL (ref 3.87–5.11)
RDW: 15.3 % (ref 11.5–15.5)
WBC: 5 10*3/uL (ref 4.0–10.5)
nRBC: 0 % (ref 0.0–0.2)

## 2022-06-02 LAB — LACTATE DEHYDROGENASE: LDH: 194 U/L — ABNORMAL HIGH (ref 98–192)

## 2022-06-02 LAB — SAVE SMEAR(SSMR), FOR PROVIDER SLIDE REVIEW

## 2022-06-02 LAB — FOLATE: Folate: 16.7 ng/mL (ref >5.9)

## 2022-06-02 LAB — VITAMIN B12: Vitamin B-12: 191 pg/mL (ref 180–914)

## 2022-06-02 NOTE — Progress Notes (Signed)
Hematology and Oncology Follow Up Visit  Victoria Ferguson [id number] February 05, 1941 82 y.o. 06/02/2022  Past Medical History:  Diagnosis Date   Diabetes mellitus    Hyperlipemia    Hypertension    Hypothyroidism    Lung nodule     Principle Diagnosis:  Thrombocytopenia  Current Therapy:   Observation  PriorTherapy:   None    Interim History:  Victoria Ferguson is back for follow-up for her thrombocytopenia. She presents with her daughter today.   They report that she has been doing well. She is followed by various specialists for her various health concerns. Recently they are working to get her in to audiology to help with her hearing.   In terms of her thrombocytopenia she has not noticed any recent infections nor has she had any bleeding episodes or bruising events. She is not on blood thinners. No upcoming procedures.      Wt Readings from Last 3 Encounters:  06/02/22 151 lb (68.5 kg)  03/24/22 151 lb 7.3 oz (68.7 kg)  01/01/22 152 lb (68.9 kg)     Medications:   Current Outpatient Medications:    aspirin EC 81 MG tablet, Take 1 tablet (81 mg total) by mouth daily. Swallow whole. (Patient taking differently: Take 81 mg by mouth at bedtime. Swallow whole.), Disp: 90 tablet, Rfl: 3   ergocalciferol (VITAMIN D2) 1.25 MG (50000 UT) capsule, Take 50,000 Units by mouth every Monday. WITH LUNCH, Disp: , Rfl:    ezetimibe (ZETIA) 10 MG tablet, Take 1 tablet (10 mg total) by mouth daily. (Patient taking differently: Take 10 mg by mouth daily with lunch.), Disp: 90 tablet, Rfl: 3   ferrous sulfate 325 (65 FE) MG tablet, Take 325 mg by mouth every 3 (three) days., Disp: , Rfl:    FREESTYLE LITE test strip, , Disp: , Rfl:    glipiZIDE (GLUCOTROL) 5 MG tablet, Take 5 mg by mouth daily with lunch., Disp: , Rfl:    hydrochlorothiazide (HYDRODIURIL) 25 MG tablet, Take 25 mg by mouth daily with lunch., Disp: , Rfl:    levothyroxine (SYNTHROID) 75 MCG tablet, Take 75 mcg by mouth daily., Disp: ,  Rfl:    losartan (COZAAR) 50 MG tablet, Take 50 mg by mouth daily with lunch., Disp: , Rfl:    metoprolol succinate (TOPROL XL) 25 MG 24 hr tablet, Take 1 tablet (25 mg total) by mouth daily. (Patient taking differently: Take 25 mg by mouth at bedtime.), Disp: 90 tablet, Rfl: 3   nitroGLYCERIN (NITROSTAT) 0.4 MG SL tablet, Place 1 tablet (0.4 mg total) under the tongue every 5 (five) minutes as needed for chest pain., Disp: 25 tablet, Rfl: 3   potassium chloride (KLOR-CON M) 10 MEQ tablet, Take 10 mEq by mouth daily with lunch., Disp: , Rfl:    rosuvastatin (CRESTOR) 40 MG tablet, Take 1 tablet (40 mg total) by mouth daily., Disp: 90 tablet, Rfl: 3  Allergies:  Allergies  Allergen Reactions   Iohexol Hives     Desc: HIVES W/ IV CONTRAST 20 YRS AGO/A.C.PRE-MED W/ PREDNISONE AND BENADRYAL OK-05/14/05   Code: HIVES, Desc: HIVES FROM IV DYE IN 1988    Cat Hair Extract Other (See Comments)    CATS- Sneezing/eye watering   Tetanus-Diphth-Acell Pertussis     Other reaction(s): redness/swelling at inj. site   Latex Itching and Rash   Tape Rash    Past Medical History, Surgical history, Social history, and Family History were reviewed and updated.  Review of Systems: Review  of Systems  Constitutional:  Negative for appetite change, fatigue, fever and unexpected weight change.  HENT:   Negative for nosebleeds.   Respiratory:  Negative for cough, hemoptysis and shortness of breath.   Cardiovascular:  Negative for chest pain.  Gastrointestinal:  Negative for blood in stool.  Endocrine: Negative for hot flashes.  Genitourinary:  Negative for hematuria.   Hematological:  Does not bruise/bleed easily.     Physical Exam:  height is '4\' 11"'$  (1.499 m) and weight is 151 lb (68.5 kg). Her oral temperature is 98.2 F (36.8 C). Her blood pressure is 118/60 and her pulse is 66. Her respiration is 19 and oxygen saturation is 98%.   Physical Exam Vitals and nursing note reviewed.  Constitutional:       General: She is not in acute distress.    Appearance: Normal appearance. She is not ill-appearing, toxic-appearing or diaphoretic.  HENT:     Head: Normocephalic and atraumatic.  Eyes:     General: No scleral icterus.    Conjunctiva/sclera: Conjunctivae normal.  Cardiovascular:     Rate and Rhythm: Normal rate and regular rhythm.     Heart sounds: Normal heart sounds.  Pulmonary:     Effort: Pulmonary effort is normal.     Breath sounds: Normal breath sounds.  Abdominal:     Palpations: Abdomen is soft.     Comments: No frank enlargement of spleen or liver  Musculoskeletal:     Cervical back: Neck supple.  Lymphadenopathy:     Cervical: No cervical adenopathy.  Skin:    Coloration: Skin is not jaundiced or pale.     Findings: No erythema or rash.  Neurological:     Mental Status: She is alert.       Lab Results  Component Value Date   WBC 5.0 06/02/2022   HGB 12.3 06/02/2022   HCT 37.7 06/02/2022   MCV 85.9 06/02/2022   PLT 99 (L) 06/02/2022     Chemistry      Component Value Date/Time   NA 138 06/02/2022 1303   NA 141 12/04/2021 1731   K 3.6 06/02/2022 1303   CL 102 06/02/2022 1303   CO2 27 06/02/2022 1303   BUN 27 (H) 06/02/2022 1303   BUN 22 12/04/2021 1731   CREATININE 1.35 (H) 06/02/2022 1303      Component Value Date/Time   CALCIUM 10.2 06/02/2022 1303   ALKPHOS 52 06/02/2022 1303   AST 39 06/02/2022 1303   ALT 21 06/02/2022 1303   BILITOT 1.1 06/02/2022 1303      Impression and Plan: 1. Thrombocytopenia ([place]) - CBC with Differential; Future - CMP ([place] only); Future - Vitamin B12; Future - Methylmalonic acid(mma), rnd urine - Folate; Future - Lactate dehydrogenase (LDH); Future - Save Smear for Provider Slide Review; Future    Victoria Ferguson is a 82 year old female who presents with Thrombocytopenia:  Platelet count today is roughly stable at 99 (previous was 108). Labs pending as shown above. Will consider Hepatitis C/B/HIV  screening at follow up if the rest of labs are unremarkable. We reviewed red flags. For now we will see them back in 6 months or sooner as needed.    RTC 6 months MD,labs    Nelwyn Salisbury PA-C 2/6/20242:25 PM

## 2022-06-03 ENCOUNTER — Encounter (HOSPITAL_COMMUNITY)
Admission: RE | Admit: 2022-06-03 | Discharge: 2022-06-03 | Disposition: A | Discharge status: Home / Self Care | Payer: Medicare Other | Source: Ambulatory Visit | Attending: Cardiology | Admitting: Cardiology

## 2022-06-03 VITALS — Ht 59.5 in | Wt 151.2 lb

## 2022-06-03 DIAGNOSIS — Z5189 Encounter for other specified aftercare: Secondary | ICD-10-CM | POA: Diagnosis not present

## 2022-06-03 DIAGNOSIS — I2089 Other forms of angina pectoris: Secondary | ICD-10-CM

## 2022-06-05 ENCOUNTER — Encounter (HOSPITAL_COMMUNITY)
Admission: RE | Admit: 2022-06-05 | Discharge: 2022-06-05 | Disposition: A | Discharge status: Home / Self Care | Payer: Medicare Other | Source: Ambulatory Visit | Attending: Cardiology | Admitting: Cardiology

## 2022-06-05 DIAGNOSIS — I2089 Other forms of angina pectoris: Secondary | ICD-10-CM

## 2022-06-05 DIAGNOSIS — Z5189 Encounter for other specified aftercare: Secondary | ICD-10-CM | POA: Diagnosis not present

## 2022-06-05 NOTE — Progress Notes (Signed)
Discharge Progress Report  Patient Details  Name: TEYANNA GARCIAGONZALEZ MRN: [id number] Date of Birth: July 30, 1940 Referring Provider:   Flowsheet Row INTENSIVE CARDIAC REHAB ORIENT from 03/24/2022 in [facility]  Referring Provider Lelon Perla, MD        Number of Visits: 38   Reason for Discharge:  Patient reached a stable level of exercise. Patient independent in their exercise. Patient has met program and personal goals.  Smoking History:  Social History   Tobacco Use  Smoking Status Former   Types: Cigarettes  Smokeless Tobacco Not on file    Diagnosis:  Chronic stable angina  ADL UCSD:   Initial Exercise Prescription:  Initial Exercise Prescription - 03/24/22 1300       Date of Initial Exercise RX and Referring Provider   Date 03/24/22    Referring Provider Lelon Perla, MD    Expected Discharge Date 05/22/22      NuStep   Level 1    SPM 75    Minutes 25    METs 1.5      Prescription Details   Frequency (times per week) 2    Duration Progress to 30 minutes of continuous aerobic without signs/symptoms of physical distress      Intensity   THRR 40-80% of Max Heartrate 62-125    Ratings of Perceived Exertion 11-13    Perceived Dyspnea 0-4      Progression   Progression Continue to progress workloads to maintain intensity without signs/symptoms of physical distress.      Resistance Training   Training Prescription Yes    Weight 1 lb    Reps 10-15             Discharge Exercise Prescription (Final Exercise Prescription Changes):  Exercise Prescription Changes - 06/05/22 1647       Response to Exercise   Blood Pressure (Admit) 98/60    Blood Pressure (Exercise) 124/68    Blood Pressure (Exit) 106/60    Heart Rate (Admit) 62 bpm    Heart Rate (Exercise) 93 bpm    Heart Rate (Exit) 69 bpm    Rating of Perceived Exertion (Exercise) 6    Perceived Dyspnea (Exercise) 0     Symptoms none    Comments Pt graduated the CRP2 program    Duration Progress to 30 minutes of  aerobic without signs/symptoms of physical distress    Intensity THRR unchanged      Progression   Progression Continue to progress workloads to maintain intensity without signs/symptoms of physical distress.    Average METs 2.4      Resistance Training   Training Prescription Yes    Weight 2 lbs wts    Reps 10-15    Time 10 Minutes      Interval Training   Interval Training No      NuStep   Level 2    SPM 77    Minutes 30    METs 2.4      Home Exercise Plan   Plans to continue exercise at Home (comment)    Frequency Add 2 additional days to program exercise sessions.    Initial Home Exercises Provided 04/17/22             Functional Capacity:  6 Minute Walk     Row Name 03/24/22 1154 06/03/22 1634       6 Minute Walk   Phase Initial Discharge  Distance 863 feet 950 feet    Distance % Change -- 10.08 %    Distance Feet Change -- 87 ft    Walk Time 6 minutes 6 minutes    # of Rest Breaks 1  Seated rest break at 3 mins 8 secs, restart at 4 mins 11 secs. 0    MPH 1.63 1.8    METS 1.23 1.34    RPE 11 13    Perceived Dyspnea  0 1    VO2 Peak 4.31 4.68    Symptoms Yes (comment) Yes (comment)    Comments Patient c/o feeling lightheaded, which she thinks was related to wearing her reading glasses, resolved with rest and removal of readers. Seated rest break taken due to fatigue. 4/10 chronic keen pain 4/10    Resting HR 56 bpm 63 bpm    Resting BP 108/72 115/66    Resting Oxygen Saturation  97 % --    Exercise Oxygen Saturation  during 6 min walk 99 % --    Max Ex. HR 86 bpm 92 bpm    Max Ex. BP 138/60 122/64    2 Minute Post BP 112/64 120/58             Psychological, QOL, Others - Outcomes: PHQ 2/9:    06/05/2022    3:49 PM 03/24/2022   11:34 AM  Depression screen PHQ 2/9  Decreased Interest 0 0  Down, Depressed, Hopeless 0 0  PHQ - 2 Score 0 0     Quality of Life:  Quality of Life - 06/05/22 1652       Quality of Life Scores   Health/Function Post 23.96 %    Socioeconomic Post 23 %    Psych/Spiritual Post 25.29 %    Family Post 15.6 %    GLOBAL Post 25.29 %             Personal Goals: Goals established at orientation with interventions provided to work toward goal.  Personal Goals and Risk Factors at Admission - 03/24/22 1255       Core Components/Risk Factors/Patient Goals on Admission    Weight Management Yes;Obesity;Weight Loss    Intervention Weight Management/Obesity: Establish reasonable short term and long term weight goals.;Obesity: Provide education and appropriate resources to help participant work on and attain dietary goals.    Admit Weight 151 lb 7.3 oz (68.7 kg)    Expected Outcomes Short Term: Continue to assess and modify interventions until short term weight is achieved;Long Term: Adherence to nutrition and physical activity/exercise program aimed toward attainment of established weight goal;Weight Loss: Understanding of general recommendations for a balanced deficit meal plan, which promotes 1-2 lb weight loss per week and includes a negative energy balance of [phone] kcal/d    Diabetes Yes    Intervention Provide education about signs/symptoms and action to take for hypo/hyperglycemia.;Provide education about proper nutrition, including hydration, and aerobic/resistive exercise prescription along with prescribed medications to achieve blood glucose in normal ranges: Fasting glucose 65-99 mg/dL    Expected Outcomes Short Term: Participant verbalizes understanding of the signs/symptoms and immediate care of hyper/hypoglycemia, proper foot care and importance of medication, aerobic/resistive exercise and nutrition plan for blood glucose control.;Long Term: Attainment of HbA1C < 7%.    Hypertension Yes    Intervention Provide education on lifestyle modifcations including regular physical activity/exercise,  weight management, moderate sodium restriction and increased consumption of fresh fruit, vegetables, and low fat dairy, alcohol moderation, and smoking cessation.;Monitor prescription use compliance.  Expected Outcomes Short Term: Continued assessment and intervention until BP is < 140/58m HG in hypertensive participants. < 130/880mHG in hypertensive participants with diabetes, heart failure or chronic kidney disease.;Long Term: Maintenance of blood pressure at goal levels.    Lipids Yes    Intervention Provide education and support for participant on nutrition & aerobic/resistive exercise along with prescribed medications to achieve LDL '70mg'$ , HDL >'40mg'$ .    Expected Outcomes Short Term: Participant states understanding of desired cholesterol values and is compliant with medications prescribed. Participant is following exercise prescription and nutrition guidelines.;Long Term: Cholesterol controlled with medications as prescribed, with individualized exercise RX and with personalized nutrition plan. Value goals: LDL < '70mg'$ , HDL > 40 mg.    Personal Goal Other Yes    Personal Goal Start exercise program. Know how much she can lift. Decrease risk for a heart attack.    Intervention Provide education on heart healthy eating, healthy mindset, and individualized exercise program including aerobic, resistance, and stretching routine to decrease risk for future cardiac event.    Expected Outcomes Patient will understand parameters for exercise and safe lifting. Patient will make healthy lifestyle changes to decrease risk for future cardiac event/heart attack.              Personal Goals Discharge:  Goals and Risk Factor Review     Row Name 04/01/22 1610 04/28/22 1521 05/27/22 1640         Core Components/Risk Factors/Patient Goals Review   Personal Goals Review Weight Management/Obesity;Hypertension;Diabetes;Lipids;Stress Weight Management/Obesity;Hypertension;Diabetes;Lipids;Stress Weight  Management/Obesity;Hypertension;Diabetes;Lipids;Stress     Review LoYork Cerisetarted intensive cardiac rehab on 04/01/22 and did well with exercise. Vital signs and CBG's were stable LoYork Ceriseontinues to do well with exercise at intensive cardiac rehab. Vital signs and CBG's were stable LoYork Ceriseontinues to do well with exercise at intensive cardiac rehab. Vital signs and CBG's were stable     Expected Outcomes LoYork Ceriseill continue to participate in intensive cardiac rehab for exercise, nutrition and lifestyle modifications LoYork Ceriseill continue to participate in intensive cardiac rehab for exercise, nutrition and lifestyle modifications LoYork Ceriseill continue to participate in intensive cardiac rehab for exercise, nutrition and lifestyle modifications              Exercise Goals and Review:  Exercise Goals     Row Name 03/24/22 1209             Exercise Goals   Increase Physical Activity Yes       Intervention Provide advice, education, support and counseling about physical activity/exercise needs.;Develop an individualized exercise prescription for aerobic and resistive training based on initial evaluation findings, risk stratification, comorbidities and participant's personal goals.       Expected Outcomes Short Term: Attend rehab on a regular basis to increase amount of physical activity.;Long Term: Add in home exercise to make exercise part of routine and to increase amount of physical activity.;Long Term: Exercising regularly at least 3-5 days a week.       Increase Strength and Stamina Yes       Intervention Provide advice, education, support and counseling about physical activity/exercise needs.;Develop an individualized exercise prescription for aerobic and resistive training based on initial evaluation findings, risk stratification, comorbidities and participant's personal goals.       Expected Outcomes Short Term: Increase workloads from initial exercise prescription for resistance, speed, and METs.;Short  Term: Perform resistance training exercises routinely during rehab and add in resistance training at home;Long Term: Improve cardiorespiratory fitness, muscular endurance  and strength as measured by increased METs and functional capacity (6MWT)       Able to understand and use rate of perceived exertion (RPE) scale Yes       Intervention Provide education and explanation on how to use RPE scale       Expected Outcomes Short Term: Able to use RPE daily in rehab to express subjective intensity level;Long Term:  Able to use RPE to guide intensity level when exercising independently       Knowledge and understanding of Target Heart Rate Range (THRR) Yes       Intervention Provide education and explanation of THRR including how the numbers were predicted and where they are located for reference       Expected Outcomes Short Term: Able to state/look up THRR;Long Term: Able to use THRR to govern intensity when exercising independently;Short Term: Able to use daily as guideline for intensity in rehab       Able to check pulse independently Yes       Intervention Provide education and demonstration on how to check pulse in carotid and radial arteries.;Review the importance of being able to check your own pulse for safety during independent exercise       Expected Outcomes Long Term: Able to check pulse independently and accurately;Short Term: Able to explain why pulse checking is important during independent exercise       Understanding of Exercise Prescription Yes       Intervention Provide education, explanation, and written materials on patient's individual exercise prescription       Expected Outcomes Short Term: Able to explain program exercise prescription;Long Term: Able to explain home exercise prescription to exercise independently                Exercise Goals Re-Evaluation:  Exercise Goals Re-Evaluation     Row Name 04/01/22 1546 04/17/22 1622 05/20/22 1648 06/05/22 1649       Exercise  Goal Re-Evaluation   Exercise Goals Review Increase Physical Activity;Increase Strength and Stamina;Able to understand and use rate of perceived exertion (RPE) scale;Knowledge and understanding of Target Heart Rate Range (THRR);Understanding of Exercise Prescription Increase Physical Activity;Increase Strength and Stamina;Able to understand and use rate of perceived exertion (RPE) scale;Knowledge and understanding of Target Heart Rate Range (THRR);Understanding of Exercise Prescription Increase Physical Activity;Increase Strength and Stamina;Able to understand and use rate of perceived exertion (RPE) scale;Knowledge and understanding of Target Heart Rate Range (THRR);Understanding of Exercise Prescription Increase Physical Activity;Increase Strength and Stamina;Able to understand and use rate of perceived exertion (RPE) scale;Knowledge and understanding of Target Heart Rate Range (THRR);Understanding of Exercise Prescription    Comments Pt first day in the CRP2 program. Pt tolerated exercise well with an average MET level of 1.7. Pt is learning her THRR, RPE and ExRx. Pt does have some leg weakness, but we will work on gaining strength Reviewed MET's, goals and home ExRx today with pt. Pt tolerated exercise well with an average MET level of 2.0. Pt is feeling good about her goals, she now knows her limits to exercise and is gaining strength and MET's. Encouraged pt that adding exercise at home is a good way to increase strength and gain more heart strength. Pt will add in walking, and physical therapy exercise for her arms and legs that she enjoys doing for 1-2 days for 15-30 mins as tolerated with fatigue and with caring for her husband. Encouraged that as little as 10 min bouts of exercise could help so  get in what she can. Reviewed MET's and goals and home ExRx. Pt tolerated exercise well with an average MET level of 2.3.Pt feels good about increasing strength and stamina and she is happy to be feeling good  with exercise. Pt graduated the [place] program today.  Pt tolerated exercise well with an average MET level of 2.4. Pt will continue to exercise on her own by walking and using her stationary bike for 3-5 days for 30 minutes as tolerated. Pt feels good about the progress she has made and enjoyed her time in CRP2    Expected Outcomes Will continue to monitor pt and progress workloads as tolerated without sign or symptom. Pt will continue to exercise on her own and gain strength. Will continue to monitor pt and progress workloads as tolerated without sign or symptom. Will continue to monitor pt and progress workloads as tolerated without sign or symptom. Will continue to monitor pt and progress workloads as tolerated without sign or symptom.             Nutrition & Weight - Outcomes:  Pre Biometrics - 03/24/22 1058       Pre Biometrics   Waist Circumference 36.75 inches    Hip Circumference 44.25 inches    Waist to Hip Ratio 0.83 %    Triceps Skinfold 34 mm    % Body Fat 43.7 %    Grip Strength 12 kg    Flexibility 11 in    Single Leg Stand 7.18 seconds             Post Biometrics - 06/03/22 1636        Post  Biometrics   Height 4' 11.5" (1.511 m)    Weight 68.6 kg    Waist Circumference 38 inches    Hip Circumference 43 inches    Waist to Hip Ratio 0.88 %    BMI (Calculated) 30.05    Triceps Skinfold 32 mm    % Body Fat 43.8 %    Grip Strength 12 kg    Flexibility 13.25 in    Single Leg Stand 3.5 seconds             Nutrition:  Nutrition Therapy & Goals - 05/22/22 1157       Nutrition Therapy   Diet Heart Healthy Diet    Drug/Food Interactions Statins/Certain Fruits      Personal Nutrition Goals   Nutrition Goal Patient to identify strategies for managing cardiovascular risk by attending the weekly [facility] education and nutrition series    Personal Goal #2 Patient to identify food sources and limit daily intake of saturated fats, trans fat, sodium, and  refined carbohydrates    Personal Goal #3 Patient to reduce sodium intake to '1500mg'$  per day    Personal Goal #4 Patient to improve diet quality by using the plate method as a daily guide for meal planning to include lean protein/plant protein, fruits, vegetables, whole grains, and nonfat dairy    Comments York Cerise continues to attend the [place] education series regularly; she does report that her hearing has been a barrier to learning. She has a plan to get hearing aids. She has been slow to adopt nutrition changes and does choose fast food or convenience food often. 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 Most recent labs have not improved but worsened; her A1c is up to 6.5%, triglycerides 166, and AST 39. York Cerise will continue to benefit from adherance to the [facility] eating  plan and participation in intensive cardiac rehab for nutrition, exercise, and lifestyle modification.      Intervention Plan   Intervention Prescribe, educate and counsel regarding individualized specific dietary modifications aiming towards targeted core components such as weight, hypertension, lipid management, diabetes, heart failure and other comorbidities.;Nutrition handout(s) given to patient.    Expected Outcomes Short Term Goal: Understand basic principles of dietary content, such as calories, fat, sodium, cholesterol and nutrients.;Long Term Goal: Adherence to prescribed nutrition plan.             Nutrition Discharge:  Nutrition Assessments - 06/08/22 1216       Rate Your Plate Scores   Pre Score 58    Post Score 69             Education Questionnaire Score:  Knowledge Questionnaire Score - 06/05/22 1653       Knowledge Questionnaire Score   Post Score 20/24             Goals reviewed with patient; copy given to patient.Pt graduates from  Intensive/Traditional cardiac rehab program today with completion of  38 exercise and education sessions. Pt maintained good attendance and  progressed nicely during their participation in rehab as evidenced by increased MET level.   Medication list reconciled. Repeat  PHQ score- 0 .  Pt has made significant lifestyle changes and should be commended for their success.  Vonne  achieved their goals during cardiac rehab.   Pt plans to continue exercise at home 3 days a week using her bike. Makaiah increased her distance on her post exercise walk test by 87 feet. We are proud of Bethannie's progress! Harrell Gave RN BSN

## 2022-06-08 DIAGNOSIS — Z1231 Encounter for screening mammogram for malignant neoplasm of breast: Secondary | ICD-10-CM | POA: Diagnosis not present

## 2022-06-08 DIAGNOSIS — Z6828 Body mass index (BMI) 28.0-28.9, adult: Secondary | ICD-10-CM | POA: Diagnosis not present

## 2022-06-08 DIAGNOSIS — Z01419 Encounter for gynecological examination (general) (routine) without abnormal findings: Secondary | ICD-10-CM | POA: Diagnosis not present

## 2022-06-08 LAB — METHYLMALONIC ACID(MMA), RND URINE
Creatinine(Crt), U: 1.23 g/L (ref 0.30–3.00)
MMA - Normalized: 4.8 umol/mmol{creat} — ABNORMAL HIGH (ref 0.5–3.4)
Methylmalonic Acid, Ur: 52.4 umol/L — ABNORMAL HIGH (ref 1.6–29.7)

## 2022-07-31 ENCOUNTER — Telehealth: Payer: Self-pay | Admitting: Cardiology

## 2022-07-31 MED ORDER — METOPROLOL SUCCINATE ER 25 MG PO TB24: 25.0000 mg | ORAL_TABLET | Freq: Every day | ORAL | 1 refills | Status: DC

## 2022-07-31 NOTE — Telephone Encounter (Signed)
Pt's medication was sent to pt's pharmacy as requested. Confirmation received.  °

## 2022-07-31 NOTE — Telephone Encounter (Signed)
*  STAT* If patient is at the pharmacy, call can be transferred to refill team.   1. Which medications need to be refilled? (please list name of each medication and dose if known) metoprolol succinate (TOPROL XL) 25 MG 24 hr tablet   2. Which pharmacy/location (including street and city if local pharmacy) is medication to be sent to? [id number] - [place], [place] - [address] AT [facility] [address])  3. Do they need a 30 day or 90 day supply? 90

## 2022-08-07 ENCOUNTER — Other Ambulatory Visit: Payer: Self-pay

## 2022-08-07 DIAGNOSIS — E7800 Pure hypercholesterolemia, unspecified: Secondary | ICD-10-CM

## 2022-08-07 MED ORDER — EZETIMIBE 10 MG PO TABS: 10.0000 mg | ORAL_TABLET | Freq: Every day | ORAL | 1 refills | Status: DC

## 2022-12-01 ENCOUNTER — Other Ambulatory Visit: Payer: Self-pay

## 2022-12-01 ENCOUNTER — Other Ambulatory Visit: Payer: Self-pay | Admitting: Medical Oncology

## 2022-12-01 ENCOUNTER — Inpatient Hospital Stay (HOSPITAL_BASED_OUTPATIENT_CLINIC_OR_DEPARTMENT_OTHER): Payer: Medicare Other | Admitting: Medical Oncology

## 2022-12-01 ENCOUNTER — Inpatient Hospital Stay: Payer: Medicare Other | Attending: Medical Oncology

## 2022-12-01 ENCOUNTER — Encounter: Payer: Self-pay | Admitting: Medical Oncology

## 2022-12-01 VITALS — BP 120/45 | HR 60 | Temp 98.0°F | Resp 18 | Ht 59.0 in | Wt 147.8 lb

## 2022-12-01 DIAGNOSIS — Z887 Allergy status to serum and vaccine status: Secondary | ICD-10-CM | POA: Diagnosis not present

## 2022-12-01 DIAGNOSIS — Z79899 Other long term (current) drug therapy: Secondary | ICD-10-CM | POA: Diagnosis not present

## 2022-12-01 DIAGNOSIS — D696 Thrombocytopenia, unspecified: Secondary | ICD-10-CM

## 2022-12-01 DIAGNOSIS — E785 Hyperlipidemia, unspecified: Secondary | ICD-10-CM | POA: Diagnosis not present

## 2022-12-01 DIAGNOSIS — Z888 Allergy status to other drugs, medicaments and biological substances status: Secondary | ICD-10-CM | POA: Diagnosis not present

## 2022-12-01 LAB — CBC WITH DIFFERENTIAL (CANCER CENTER ONLY)
Abs Immature Granulocytes: 0.02 10*3/uL (ref 0.00–0.07)
Basophils Absolute: 0 10*3/uL (ref 0.0–0.1)
Basophils Relative: 0 %
Eosinophils Absolute: 0.1 10*3/uL (ref 0.0–0.5)
Eosinophils Relative: 1 %
HCT: 38.3 % (ref 36.0–46.0)
Hemoglobin: 12.3 g/dL (ref 12.0–15.0)
Immature Granulocytes: 0 %
Lymphocytes Relative: 23 %
Lymphs Abs: 1.1 10*3/uL (ref 0.7–4.0)
MCH: 28.7 pg (ref 26.0–34.0)
MCHC: 32.1 g/dL (ref 30.0–36.0)
MCV: 89.3 fL (ref 80.0–100.0)
Monocytes Absolute: 0.5 10*3/uL (ref 0.1–1.0)
Monocytes Relative: 10 %
Neutro Abs: 3.1 10*3/uL (ref 1.7–7.7)
Neutrophils Relative %: 66 %
Platelet Count: 92 10*3/uL — ABNORMAL LOW (ref 150–400)
RBC: 4.29 MIL/uL (ref 3.87–5.11)
RDW: 15.2 % (ref 11.5–15.5)
WBC Count: 4.9 10*3/uL (ref 4.0–10.5)
nRBC: 0 % (ref 0.0–0.2)

## 2022-12-01 LAB — HEPATITIS B CORE ANTIBODY, TOTAL: Hep B Core Total Ab: NONREACTIVE

## 2022-12-01 LAB — CMP (CANCER CENTER ONLY)
ALT: 17 U/L (ref 0–44)
AST: 34 U/L (ref 15–41)
Albumin: 3.8 g/dL (ref 3.5–5.0)
Alkaline Phosphatase: 49 U/L (ref 38–126)
Anion gap: 7 (ref 5–15)
BUN: 27 mg/dL — ABNORMAL HIGH (ref 8–23)
CO2: 28 mmol/L (ref 22–32)
Calcium: 10.1 mg/dL (ref 8.9–10.3)
Chloride: 103 mmol/L (ref 98–111)
Creatinine: 1.64 mg/dL — ABNORMAL HIGH (ref 0.44–1.00)
GFR, Estimated: 31 mL/min — ABNORMAL LOW (ref >60)
Glucose, Bld: 98 mg/dL (ref 70–99)
Potassium: 3.8 mmol/L (ref 3.5–5.1)
Sodium: 138 mmol/L (ref 135–145)
Total Bilirubin: 1.2 mg/dL (ref 0.3–1.2)
Total Protein: 7 g/dL (ref 6.5–8.1)

## 2022-12-01 LAB — HEPATITIS C ANTIBODY: HCV Ab: NONREACTIVE

## 2022-12-01 LAB — HEPATITIS B SURFACE ANTIGEN: Hepatitis B Surface Ag: NONREACTIVE

## 2022-12-01 LAB — VITAMIN B12: Vitamin B-12: 488 pg/mL (ref 180–914)

## 2022-12-01 LAB — FOLATE: Folate: 17.7 ng/mL (ref >5.9)

## 2022-12-01 MED ORDER — FOLIC ACID 1 MG PO TABS: 1.0000 mg | ORAL_TABLET | Freq: Every day | ORAL | 3 refills | Status: DC

## 2022-12-01 NOTE — Progress Notes (Signed)
Hematology and Oncology Follow Up Visit  Victoria Ferguson [id number] October 20, 1940 82 y.o. 12/01/2022  Past Medical History:  Diagnosis Date   Diabetes mellitus    Hyperlipemia    Hypertension    Hypothyroidism    Lung nodule     Principle Diagnosis:  Thrombocytopenia  Current Therapy:   Observation  PriorTherapy:   None    Interim History:  Victoria Ferguson is back for follow-up for Victoria Ferguson thrombocytopenia. She presents with Victoria Ferguson daughter today.   They report that she has been doing well other than Victoria Ferguson chronic fatigue secondary to Victoria Ferguson cholesterol medications. She continues to be followed closely by Victoria Ferguson many specialists. She sees both cardiology and PCP. 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 She has history of CAD- too occluded to stent- bypass grafting deferred due to Victoria Ferguson caretaker status.   In terms of Victoria Ferguson thrombocytopenia she has not noticed any recent infections nor has she had any bleeding episodes or bruising events. She is not on blood thinners. No upcoming procedures.      Wt Readings from Last 3 Encounters:  12/01/22 147 lb 12.8 oz (67 kg)  06/03/22 151 lb 3.8 oz (68.6 kg)  06/02/22 151 lb (68.5 kg)     Medications:   Current Outpatient Medications:    aspirin EC 81 MG tablet, Take 1 tablet (81 mg total) by mouth daily. Swallow whole. (Patient taking differently: Take 81 mg by mouth at bedtime. Swallow whole.), Disp: 90 tablet, Rfl: 3   cyanocobalamin (VITAMIN B12) 1000 MCG tablet, Take 1,000 mcg by mouth daily., Disp: , Rfl:    ergocalciferol (VITAMIN D2) 1.25 MG (50000 UT) capsule, Take 50,000 Units by mouth every Monday. WITH LUNCH, Disp: , Rfl:    ezetimibe (ZETIA) 10 MG tablet, Take 1 tablet (10 mg total) by mouth daily., Disp: 90 tablet, Rfl: 1   ferrous sulfate 325 (65 FE) MG tablet, Take 325 mg by mouth every 3 (three) days., Disp: , Rfl:    folic acid (FOLVITE) 1 MG tablet, Take 1 tablet (1 mg total) by mouth daily., Disp: 30 tablet, Rfl: 3   FREESTYLE  LITE test strip, , Disp: , Rfl:    glipiZIDE (GLUCOTROL) 5 MG tablet, Take 5 mg by mouth daily with lunch., Disp: , Rfl:    hydrochlorothiazide (HYDRODIURIL) 25 MG tablet, Take 25 mg by mouth daily with lunch., Disp: , Rfl:    levothyroxine (SYNTHROID) 75 MCG tablet, Take 75 mcg by mouth daily., Disp: , Rfl:    losartan (COZAAR) 50 MG tablet, Take 50 mg by mouth daily with lunch., Disp: , Rfl:    metoprolol succinate (TOPROL XL) 25 MG 24 hr tablet, Take 1 tablet (25 mg total) by mouth daily., Disp: 90 tablet, Rfl: 1   potassium chloride (KLOR-CON M) 10 MEQ tablet, Take 10 mEq by mouth daily with lunch., Disp: , Rfl:    rosuvastatin (CRESTOR) 40 MG tablet, Take 1 tablet (40 mg total) by mouth daily., Disp: 90 tablet, Rfl: 3   nitroGLYCERIN (NITROSTAT) 0.4 MG SL tablet, Place 1 tablet (0.4 mg total) under the tongue every 5 (five) minutes as needed for chest pain. (Patient not taking: Reported on 12/01/2022), Disp: 25 tablet, Rfl: 3  Allergies:  Allergies  Allergen Reactions   Cat Hair Extract Other (See Comments)    CATS- Sneezing/eye watering   Iohexol Hives     Desc: HIVES W/ IV CONTRAST 20 YRS AGO/A.C.PRE-MED W/ PREDNISONE AND BENADRYAL OK-05/14/05   Code:  HIVES, Desc: HIVES FROM IV DYE IN 1988    Tetanus-Diphth-Acell Pertussis Swelling    Other reaction(s): redness/swelling at inj. site   Latex Itching and Rash   Tape Rash    Past Medical History, Surgical history, Social history, and Family History were reviewed and updated.  Review of Systems: Review of Systems  Constitutional:  Negative for appetite change, fatigue, fever and unexpected weight change.  HENT:   Negative for nosebleeds.   Respiratory:  Negative for cough, hemoptysis and shortness of breath.   Cardiovascular:  Negative for chest pain.  Gastrointestinal:  Negative for blood in stool.  Endocrine: Negative for hot flashes.  Genitourinary:  Negative for hematuria.   Hematological:  Does not bruise/bleed easily.      Physical Exam:  height is 4\' 11"  (1.499 m) and weight is 147 lb 12.8 oz (67 kg). Victoria Ferguson oral temperature is 98 F (36.7 C). Victoria Ferguson blood pressure is 120/45 (abnormal) and Victoria Ferguson pulse is 60. Victoria Ferguson respiration is 18 and oxygen saturation is 98%.   Physical Exam Vitals and nursing note reviewed.  Constitutional:      General: She is not in acute distress.    Appearance: Normal appearance. She is not ill-appearing, toxic-appearing or diaphoretic.  HENT:     Head: Normocephalic and atraumatic.  Eyes:     General: No scleral icterus.    Conjunctiva/sclera: Conjunctivae normal.  Cardiovascular:     Rate and Rhythm: Normal rate and regular rhythm.     Heart sounds: Normal heart sounds.  Pulmonary:     Effort: Pulmonary effort is normal.     Breath sounds: Normal breath sounds.  Abdominal:     Palpations: Abdomen is soft.     Comments: No frank enlargement of spleen or liver  Musculoskeletal:     Cervical back: Neck supple.  Lymphadenopathy:     Cervical: No cervical adenopathy.  Skin:    Coloration: Skin is not jaundiced or pale.     Findings: No erythema or rash.  Neurological:     Mental Status: She is alert.       Lab Results  Component Value Date   WBC 4.9 12/01/2022   HGB 12.3 12/01/2022   HCT 38.3 12/01/2022   MCV 89.3 12/01/2022   PLT 92 (L) 12/01/2022     Chemistry      Component Value Date/Time   NA 138 12/01/2022 1234   NA 141 12/04/2021 1731   K 3.8 12/01/2022 1234   CL 103 12/01/2022 1234   CO2 28 12/01/2022 1234   BUN 27 (H) 12/01/2022 1234   BUN 22 12/04/2021 1731   CREATININE 1.64 (H) 12/01/2022 1234      Component Value Date/Time   CALCIUM 10.1 12/01/2022 1234   ALKPHOS 49 12/01/2022 1234   AST 34 12/01/2022 1234   ALT 17 12/01/2022 1234   BILITOT 1.2 12/01/2022 1234      Impression and Plan: 1. Thrombocytopenia (HCC) - Vitamin B12; Future - Folate; Future     Victoria Ferguson is a 82 year old female who presents with  Thrombocytopenia:  Platelet count down slightly today to 92. Will check additional labs and have Victoria Ferguson start a folate supplement along with Victoria Ferguson B12 supplement. We will see Victoria Ferguson back in 3 months for recheck.   RTC 6 months MD,labs    Clent Jacks PA-C 8/6/20241:45 PM

## 2022-12-04 ENCOUNTER — Telehealth: Payer: Self-pay | Admitting: *Deleted

## 2022-12-04 NOTE — Telephone Encounter (Signed)
Per 12/01/22 los - called patient and lvm of upcoming appointments, requested call back to confirm.

## 2022-12-11 DIAGNOSIS — Z205 Contact with and (suspected) exposure to viral hepatitis: Secondary | ICD-10-CM | POA: Diagnosis not present

## 2022-12-23 NOTE — Progress Notes (Signed)
Cardiology Office Note:    Date:  01/06/2023   ID:  Victoria Ferguson, DOB Sep 07, 1940, MRN [id number]  PCP:  Victoria Screws, MD   [facility] Providers Cardiologist:  Victoria Millers, MD Cardiology APP:  Victoria Duster, PA     Referring MD: Victoria Screws, MD   Chief Complaint  Patient presents with   Follow-up  CAD  History of Present Illness:    Victoria Ferguson is a 82 y.o. female with a hx of CAD, hypertension, hyperlipidemia.  He had a cardiac CTA December 2022 with elevated calcium score 2610 placing him in the 98th percentile with 25 to 49% left main, 50 to 69% LCx, occluded mid LAD with collaterals, 7090% RCA and PFO.  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  Interestingly FFR showed total occlusion but no other obstructive disease.  She was recommended for cardiac catheterization; however, platelet count had decreased to 50,000.  Hematology was consulted and platelets improved.  In discussion with Dr. Jens Ferguson in March 2023, she preferred to wait until May for definitive angiography as she was moving.  I saw her in clinic 09/24/2021 to discuss definitive angiography.  Due to several stressful life events, she wanted to defer heart catheterization. She eventually had heart cath 12/19/21 which showed CTO of LAD with collaterals and moderate disease in the RCA. Medical therapy was recommended.   I saw her for post cath follow up 12/2021. We discussed low sodium diet (LVEDP elevated during cath) as she uses a lot of salt when cooking.   She presents for annual follow up. She is doing well but is having leg pain in the morning. She wonders if this is due to the higher dose of crestor (was on 10 mg 5 days per week). I reviewed recent labs and her creatinine has increased with a Cr Cl now 29. Does not appear medications have yet been adjusted. Thankfully no chest pain, SOB, or orthopnea. She has been on hydrochlorothiazide for many years.    Past Medical History:  Diagnosis Date    Diabetes mellitus    Hyperlipemia    Hypertension    Hypothyroidism    Lung nodule     Past Surgical History:  Procedure Laterality Date   ABDOMINAL HYSTERECTOMY  04/27/1986   CARDIAC CATHETERIZATION     CATARACT EXTRACTION Bilateral    CHOLECYSTECTOMY  04/27/1988   LEFT HEART CATH AND CORONARY ANGIOGRAPHY N/A 12/19/2021   Procedure: LEFT HEART CATH AND CORONARY ANGIOGRAPHY;  Surgeon: Victoria Lex, MD;  [facility] INVASIVE CV LAB;  Service: Cardiovascular;  Laterality: N/A;   TUBAL LIGATION  04/27/1972    Current Medications: Current Meds  Medication Sig   aspirin EC 81 MG tablet Take 1 tablet (81 mg total) by mouth daily. Swallow whole. (Patient taking differently: Take 81 mg by mouth at bedtime. Swallow whole.)   cyanocobalamin (VITAMIN B12) 1000 MCG tablet Take 1,000 mcg by mouth daily.   DERMOTIC 0.01 % OIL Place 20 mLs in ear(s) as needed.   ergocalciferol (VITAMIN D2) 1.25 MG (50000 UT) capsule Take 50,000 Units by mouth every Monday. WITH LUNCH   ezetimibe (ZETIA) 10 MG tablet Take 1 tablet (10 mg total) by mouth daily.   ferrous sulfate 325 (65 FE) MG tablet Take 325 mg by mouth every 3 (three) days.   folic acid (FOLVITE) 1 MG tablet Take 1 tablet (1 mg total) by mouth daily.   FREESTYLE LITE test strip    glipiZIDE (GLUCOTROL) 5 MG tablet  Take 5 mg by mouth daily with lunch.   levothyroxine (SYNTHROID) 75 MCG tablet Take 75 mcg by mouth daily.   losartan (COZAAR) 50 MG tablet Take 25 mg by mouth daily with lunch. Take 1/2 Tablet Daily   metoprolol succinate (TOPROL XL) 25 MG 24 hr tablet Take 1 tablet (25 mg total) by mouth daily.   nitroGLYCERIN (NITROSTAT) 0.4 MG SL tablet Place 1 tablet (0.4 mg total) under the tongue every 5 (five) minutes as needed for chest pain.   rosuvastatin (CRESTOR) 10 MG tablet Take 1 tablet (10 mg total) by mouth daily.   [DISCONTINUED] hydrochlorothiazide (HYDRODIURIL) 25 MG tablet Take 25 mg by mouth daily with lunch.    [DISCONTINUED] potassium chloride (KLOR-CON M) 10 MEQ tablet Take 10 mEq by mouth daily with lunch.   [DISCONTINUED] rosuvastatin (CRESTOR) 40 MG tablet TAKE 1 TABLET DAILY     Allergies:   Cat hair extract, Iohexol, Tetanus-diphth-acell pertussis, Latex, and Tape   Social History   Socioeconomic History   Marital status: Married    Spouse name: Not on file   Number of children: 4   Years of education: 13   Highest education level: Some college, no degree  Occupational History   Occupation: Retired  Tobacco Use   Smoking status: Former    Types: Cigarettes   Smokeless tobacco: Not on Ship broker   Vaping status: Never Used  Substance and Sexual Activity   Alcohol use: Never   Drug use: No   Sexual activity: Not Currently  Other Topics Concern   Not on file  Social History Narrative   Not on file   Social Determinants of Health   Financial Resource Strain: Not on file  Food Insecurity: No Food Insecurity (06/02/2022)   Hunger Vital Sign    Worried About Running Out of Food in the Last Year: Never true    Ran Out of Food in the Last Year: Never true  Transportation Needs: No Transportation Needs (06/02/2022)   PRAPARE - Administrator, Civil Service (Medical): No    Lack of Transportation (Non-Medical): No  Physical Activity: Unknown (06/02/2022)   Exercise Vital Sign    Days of Exercise per Week: 2 days    Minutes of Exercise per Session: Not on file  Stress: Not on file  Social Connections: Not on file     Family History: The patient's family history includes CAD in her father; Heart failure in her father and mother.  ROS:   Please see the history of present illness.     All other systems reviewed and are negative.  EKGs/Labs/Other Studies Reviewed:    The following studies were reviewed today:  Left heart cath 12/19/21:   Prox LAD to Mid LAD lesion is 100% stenosed.   Dist Cx lesion is 40% stenosed.  1st Mrg lesion is 45% stenosed.   Prox  RCA-1 lesion is 40% stenosed.   Prox RCA-2 lesion is 60% stenosed. ->  Most likely potential PCI target if indicated.   Prox RCA to Mid RCA lesion is 50% stenosed.   There is hyperdynamic left ventricular systolic function.  The left ventricular ejection fraction is greater than 65% by visual estimate.   LV end diastolic pressure is mildly elevated.   There is no aortic valve stenosis.   POST CATH DIAGNOSES Severe CAD: 100% flush CTO of the LAD after septal perforator.  After 20+ mm segment of occlusion, vessel fills via L-L collaterals from distal  LCx-OM, and R-L collaterals from distal PDA-distal LAD and septal perforators to the LAD perforators. RCA has diffuse 40-60 and 50% stenoses in the proximal to mid segment.  (Per CTA, distal vessel FFR was borderline at 0.8, but mid vessel was only 0.5). Large-caliber codominant LCx with very large OM 1 that bifurcates (30% stenosis with OM 2-LPL 1 with ostial 40%. Hyperdynamic LV function EF > 65%, and minimally elevated LVEDP.       RECOMMENDATIONS Discharge home with plans for medical management.  Consider titrating up beta-blocker dose and potentially adding long-acting nitrate, amlodipine or Ranexa. If unable to control symptoms with medications, could consider segmental PCI of the RCA as this was the only vessel that was borderline FFR positive.  The diffuse moderate disease would likely require an extensive amount of stenting. LAD is flush occluded did not likely to be a candidate for CTO PCI.Marland Kitchen       Echo 04/08/19: 1. Left ventricular ejection fraction, by estimation, is 65 to 70%. The  left ventricle has normal function. The left ventricle has no regional  wall motion abnormalities. There is moderate asymmetric left ventricular  hypertrophy of the basal-septal  segment. Left ventricular diastolic parameters are consistent with Grade I  diastolic dysfunction (impaired relaxation).   2. Right ventricular systolic function is normal. The  right ventricular  size is normal.   3. The mitral valve is normal in structure. Trivial mitral valve  regurgitation.   4. The aortic valve is tricuspid. There is mild calcification of the  aortic valve. There is mild thickening of the aortic valve. Aortic valve  regurgitation is not visualized. Aortic valve sclerosis/calcification is  present, without any evidence of  aortic stenosis.   5. The inferior vena cava is normal in size with greater than 50%  respiratory variability, suggesting right atrial pressure of 3 mmHg.  EKG Interpretation Date/Time:  Wednesday January 06 2023 13:32:50 EDT Ventricular Rate:  58 PR Interval:  152 QRS Duration:  106 QT Interval:  444 QTC Calculation: 435 R Axis:   -48  Text Interpretation: Sinus bradycardia Left axis deviation Incomplete right bundle branch block Nonspecific T wave abnormality When compared with ECG of 14-Jan-2015 12:54, Vent. rate has decreased BY  31 BPM Confirmed by Micah Flesher ([id number]) on 01/06/2023 1:39:39 PM    Recent Labs: 12/01/2022: ALT 17; BUN 27; Creatinine 1.64; Hemoglobin 12.3; Platelet Count 92; Potassium 3.8; Sodium 138  Recent Lipid Panel    Component Value Date/Time   CHOL 116 09/24/2021 1605   TRIG 156 (H) 09/24/2021 1605   HDL 49 09/24/2021 1605   CHOLHDL 2.4 09/24/2021 1605   LDLCALC 41 09/24/2021 1605   LDLDIRECT 39 09/24/2021 1605     Risk Assessment/Calculations:                Physical Exam:    VS:  BP 112/68 (BP Location: Left Arm, Patient Position: Sitting, Cuff Size: Normal)   Pulse (!) 58   Ht 5' (1.524 m)   Wt 148 lb 12.8 oz (67.5 kg)   SpO2 97%   BMI 29.06 kg/m     Wt Readings from Last 3 Encounters:  01/06/23 148 lb 12.8 oz (67.5 kg)  12/01/22 147 lb 12.8 oz (67 kg)  06/03/22 151 lb 3.8 oz (68.6 kg)     GEN:  Well nourished, well developed in no acute distress HEENT: Normal NECK: No JVD; No carotid bruits LYMPHATICS: No lymphadenopathy CARDIAC: RRR, no murmurs, rubs,  gallops RESPIRATORY:  Clear  to auscultation without rales, wheezing or rhonchi  ABDOMEN: Soft, non-tender, non-distended MUSCULOSKELETAL:  No edema; No deformity  SKIN: Warm and dry NEUROLOGIC:  Alert and oriented x 3 PSYCHIATRIC:  Normal affect   ASSESSMENT:    1. Coronary artery disease involving native coronary artery of native heart without angina pectoris   2. Thrombocytopenia (HCC)   3. Stage 2 chronic kidney disease   4. Primary hypertension    PLAN:    In order of problems listed above:  CAD CTA coronary concerning for LAD disease GDMT includes aspirin, Toprol, statin Left heart catheterization with CTO of LAD with collaterals, no PCI. Medical therapy recommended with titration of long acting nitrate, amlodipine, or ranexa.  - no chest pain - I am titrating medications and may be able to add amlodipine if BP control needed with the below medication changes   Thrombocytopenia Platelet count improved to 108k, now back in the 90s - on folate and B12 -B12 now improved   Hypertension PTA Toprol 25 mg, 50 mg losartan, 25 mg hydrochlorothiazide - stop hydrochlorothiazide - reduce losartan to 25 mg - recheck at next office visit   Hyperlipidemia with LDL goal < 70 She is on 40 mg rosuvastatin and 10 mg Zetia 09/24/2021: Cholesterol, Total 116; HDL 49; LDL Chol Calc (NIH) 41; Triglycerides 156 Given rise in creatine, I will reduce to 10 mg crestor after a 2 week statin holiday   CKD unknown stage - sCr now 1.64 - stop 40 mg crestor, restart 10 mg crestor after 2 week statin holiday - stop hydrochlorothiazide - reduce losartan to 25 mg - will also stop potassium - recheck BMP in 1 months - low sodium diet now off of hydrochlorothiazide, I did discuss she may have some lower extremity edema and may require lasix, but would like to hold off on that for now - gave ER precautions - if sCr remains elevated, will need a referral to nephrology - will also stop potassium  supplement       Medication Adjustments/Labs and Tests Ordered: Current medicines are reviewed at length with the patient today.  Concerns regarding medicines are outlined above.  Orders Placed This Encounter  Procedures   Basic metabolic panel   EKG 12-Lead   Meds ordered this encounter  Medications   rosuvastatin (CRESTOR) 10 MG tablet    Sig: Take 1 tablet (10 mg total) by mouth daily.    Dispense:  90 tablet    Refill:  3    Patient Instructions  Medication Instructions:  Stop hydrochlorothiazide. Stop Potassium. Decrease Losartan to 25 mg ( Take 1/2 Of a 50 mg Tablet Daily). Stop Crestor 40 mg For 2 Weeks. Start Crestor 10 mg ( Take 1 Tablet Daily). *If you need a refill on your cardiac medications before your next appointment, please call your pharmacy*   Lab Work: BMET :  To Be Done Week of Follow up Appointment. If you have labs (blood work) drawn today and your tests are completely normal, you will receive your results only by: MyChart Message (if you have MyChart) OR A paper copy in the mail If you have any lab test that is abnormal or we need to change your treatment, we will call you to review the results.   Testing/Procedures: No Testing   Follow-Up: At [facility], you and your health needs are our priority.  As part of our continuing mission to provide you with exceptional heart care, we have created designated Provider Care Teams.  These Care Teams include your primary Cardiologist (physician) and Advanced Practice Providers (APPs -  Physician Assistants and Nurse Practitioners) who all work together to provide you with the care you need, when you need it.    Your next appointment:   1 month(s)  Provider:   Micah Flesher, PA-C      Signed, Roe Rutherford Praneeth Bussey, [place]  01/06/2023 2:19 PM    [facility]

## 2023-01-05 ENCOUNTER — Other Ambulatory Visit: Payer: Self-pay | Admitting: Cardiology

## 2023-01-05 DIAGNOSIS — I2510 Atherosclerotic heart disease of native coronary artery without angina pectoris: Secondary | ICD-10-CM

## 2023-01-06 ENCOUNTER — Ambulatory Visit: Payer: Medicare Other | Attending: Physician Assistant | Admitting: Physician Assistant

## 2023-01-06 ENCOUNTER — Encounter: Payer: Self-pay | Admitting: Physician Assistant

## 2023-01-06 VITALS — BP 112/68 | HR 58 | Ht 60.0 in | Wt 148.8 lb

## 2023-01-06 DIAGNOSIS — I10 Essential (primary) hypertension: Secondary | ICD-10-CM | POA: Diagnosis not present

## 2023-01-06 DIAGNOSIS — D696 Thrombocytopenia, unspecified: Secondary | ICD-10-CM | POA: Diagnosis not present

## 2023-01-06 DIAGNOSIS — I2510 Atherosclerotic heart disease of native coronary artery without angina pectoris: Secondary | ICD-10-CM | POA: Diagnosis not present

## 2023-01-06 DIAGNOSIS — N182 Chronic kidney disease, stage 2 (mild): Secondary | ICD-10-CM | POA: Insufficient documentation

## 2023-01-06 MED ORDER — ROSUVASTATIN CALCIUM 10 MG PO TABS
10.0000 mg | ORAL_TABLET | Freq: Every day | ORAL | 3 refills | Status: DC
Start: 2023-01-06 — End: 2023-01-08

## 2023-01-06 NOTE — Patient Instructions (Addendum)
Medication Instructions:  Stop hydrochlorothiazide. Stop Potassium. Decrease Losartan to 25 mg ( Take 1/2 Of a 50 mg Tablet Daily). Stop Crestor 40 mg For 2 Weeks. Start Crestor 10 mg ( Take 1 Tablet Daily). *If you need a refill on your cardiac medications before your next appointment, please call your pharmacy*   Lab Work: BMET :  To Be Done Week of Follow up Appointment. If you have labs (blood work) drawn today and your tests are completely normal, you will receive your results only by: MyChart Message (if you have MyChart) OR A paper copy in the mail If you have any lab test that is abnormal or we need to change your treatment, we will call you to review the results.   Testing/Procedures: No Testing   Follow-Up: At [facility], you and your health needs are our priority.  As part of our continuing mission to provide you with exceptional heart care, we have created designated Provider Care Teams.  These Care Teams include your primary Cardiologist (physician) and Advanced Practice Providers (APPs -  Physician Assistants and Nurse Practitioners) who all work together to provide you with the care you need, when you need it.    Your next appointment:   1 month(s)  Provider:   Micah Flesher, PA-C

## 2023-01-08 ENCOUNTER — Other Ambulatory Visit: Payer: Self-pay

## 2023-01-08 DIAGNOSIS — E7800 Pure hypercholesterolemia, unspecified: Secondary | ICD-10-CM

## 2023-01-08 MED ORDER — ROSUVASTATIN CALCIUM 10 MG PO TABS: 10.0000 mg | ORAL_TABLET | Freq: Every day | ORAL | 3 refills | Status: DC

## 2023-01-08 MED ORDER — EZETIMIBE 10 MG PO TABS: 10.0000 mg | ORAL_TABLET | Freq: Every day | ORAL | 3 refills | Status: DC

## 2023-01-15 DIAGNOSIS — Z23 Encounter for immunization: Secondary | ICD-10-CM | POA: Diagnosis not present

## 2023-01-31 ENCOUNTER — Other Ambulatory Visit: Payer: Self-pay | Admitting: Cardiology

## 2023-02-01 DIAGNOSIS — I2510 Atherosclerotic heart disease of native coronary artery without angina pectoris: Secondary | ICD-10-CM | POA: Diagnosis not present

## 2023-02-02 LAB — BASIC METABOLIC PANEL
BUN/Creatinine Ratio: 15 (ref 12–28)
BUN: 16 mg/dL (ref 8–27)
CO2: 23 mmol/L (ref 20–29)
Calcium: 9.3 mg/dL (ref 8.7–10.3)
Chloride: 106 mmol/L (ref 96–106)
Creatinine, Ser: 1.07 mg/dL — ABNORMAL HIGH (ref 0.57–1.00)
Glucose: 68 mg/dL — ABNORMAL LOW (ref 70–99)
Potassium: 4 mmol/L (ref 3.5–5.2)
Sodium: 140 mmol/L (ref 134–144)
eGFR: 52 mL/min/{1.73_m2} — ABNORMAL LOW (ref >59)

## 2023-02-03 NOTE — Progress Notes (Signed)
Cardiology Office Note:    Date:  02/05/2023   ID:  Victoria Ferguson, DOB [birth year], MRN [id number]  PCP:  Henrine Screws, MD   [facility] Providers Cardiologist:  Olga Millers, MD Cardiology APP:  Marcelino Duster, PA     Referring MD: Henrine Screws, MD   Chief Complaint  Patient presents with   Follow-up    CAD, HTN    History of Present Illness:    Victoria Ferguson is a 82 y.o. female with a hx of CAD, hypertension, hyperlipidemia.  He had a cardiac CTA December 2022 with elevated calcium score 2610 placing him in the 98th percentile with 25 to 49% left main, 50 to 69% LCx, occluded mid LAD with collaterals, 7090% RCA and PFO.  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  Interestingly FFR showed total occlusion but no other obstructive disease.  She was recommended for cardiac catheterization; however, platelet count had decreased to 50,000.  Hematology was consulted and platelets improved.  In discussion with Dr. Jens Som in March 2023, she preferred to wait until May for definitive angiography as she was moving.  I saw her in clinic 09/24/2021 to discuss definitive angiography.  Due to several stressful life events, she wanted to defer heart catheterization. She eventually had heart cath 12/19/21 which showed CTO of LAD with collaterals and moderate disease in the RCA. Medical therapy was recommended.   I saw her for post cath follow up 12/2021. We discussed low sodium diet (LVEDP elevated during cath) as she uses a lot of salt when cooking.   Given rise in sCr to 1.64, I stopped hydrochlorothiazide and reduced losartan to 25 mg. She complained of leg and arm pain and was still on 40 mg crestor despite rising creatinine. I recommended statin holiday and resumption of 10 mg crestor. Recheck of BMP showed sCr 1.07.  She returns for follow up. She states she is not as tired now and feels like her leg pain is better, but still with knee pain. Her appetite has improved, she continues  to cook country fried steak and eats sweets. She tries to be mindful of salt. Overall she is feeling better, with mild ankle edema, no compression stockings in place.    Past Medical History:  Diagnosis Date   Diabetes mellitus    Hyperlipemia    Hypertension    Hypothyroidism    Lung nodule     Past Surgical History:  Procedure Laterality Date   ABDOMINAL HYSTERECTOMY  04/27/1986   CARDIAC CATHETERIZATION     CATARACT EXTRACTION Bilateral    CHOLECYSTECTOMY  04/27/1988   LEFT HEART CATH AND CORONARY ANGIOGRAPHY N/A 12/19/2021   Procedure: LEFT HEART CATH AND CORONARY ANGIOGRAPHY;  Surgeon: Marykay Lex, MD;  Location: [facility] INVASIVE CV LAB;  Service: Cardiovascular;  Laterality: N/A;   TUBAL LIGATION  04/27/1972    Current Medications: Current Meds  Medication Sig   amLODipine (NORVASC) 2.5 MG tablet Take 1 tablet (2.5 mg total) by mouth daily.   aspirin EC 81 MG tablet Take 1 tablet (81 mg total) by mouth daily. Swallow whole. (Patient taking differently: Take 81 mg by mouth at bedtime. Swallow whole.)   cyanocobalamin (VITAMIN B12) 1000 MCG tablet Take 1,000 mcg by mouth daily.   DERMOTIC 0.01 % OIL Place 20 mLs in ear(s) as needed.   ergocalciferol (VITAMIN D2) 1.25 MG (50000 UT) capsule Take 50,000 Units by mouth every Monday. WITH LUNCH   ezetimibe (ZETIA) 10 MG tablet Take 1 tablet (  Cardiology Office Note:    Date:  02/05/2023   ID:  Victoria Ferguson, DOB [birth year], MRN [id number]  PCP:  Henrine Screws, MD   [facility] Providers Cardiologist:  Olga Millers, MD Cardiology APP:  Marcelino Duster, PA     Referring MD: Henrine Screws, MD   Chief Complaint  Patient presents with   Follow-up    CAD, HTN    History of Present Illness:    Victoria Ferguson is a 82 y.o. female with a hx of CAD, hypertension, hyperlipidemia.  He had a cardiac CTA December 2022 with elevated calcium score 2610 placing him in the 98th percentile with 25 to 49% left main, 50 to 69% LCx, occluded mid LAD with collaterals, 7090% RCA and PFO.  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  Interestingly FFR showed total occlusion but no other obstructive disease.  She was recommended for cardiac catheterization; however, platelet count had decreased to 50,000.  Hematology was consulted and platelets improved.  In discussion with Dr. Jens Som in March 2023, she preferred to wait until May for definitive angiography as she was moving.  I saw her in clinic 09/24/2021 to discuss definitive angiography.  Due to several stressful life events, she wanted to defer heart catheterization. She eventually had heart cath 12/19/21 which showed CTO of LAD with collaterals and moderate disease in the RCA. Medical therapy was recommended.   I saw her for post cath follow up 12/2021. We discussed low sodium diet (LVEDP elevated during cath) as she uses a lot of salt when cooking.   Given rise in sCr to 1.64, I stopped hydrochlorothiazide and reduced losartan to 25 mg. She complained of leg and arm pain and was still on 40 mg crestor despite rising creatinine. I recommended statin holiday and resumption of 10 mg crestor. Recheck of BMP showed sCr 1.07.  She returns for follow up. She states she is not as tired now and feels like her leg pain is better, but still with knee pain. Her appetite has improved, she continues  to cook country fried steak and eats sweets. She tries to be mindful of salt. Overall she is feeling better, with mild ankle edema, no compression stockings in place.    Past Medical History:  Diagnosis Date   Diabetes mellitus    Hyperlipemia    Hypertension    Hypothyroidism    Lung nodule     Past Surgical History:  Procedure Laterality Date   ABDOMINAL HYSTERECTOMY  04/27/1986   CARDIAC CATHETERIZATION     CATARACT EXTRACTION Bilateral    CHOLECYSTECTOMY  04/27/1988   LEFT HEART CATH AND CORONARY ANGIOGRAPHY N/A 12/19/2021   Procedure: LEFT HEART CATH AND CORONARY ANGIOGRAPHY;  Surgeon: Marykay Lex, MD;  Location: [facility] INVASIVE CV LAB;  Service: Cardiovascular;  Laterality: N/A;   TUBAL LIGATION  04/27/1972    Current Medications: Current Meds  Medication Sig   amLODipine (NORVASC) 2.5 MG tablet Take 1 tablet (2.5 mg total) by mouth daily.   aspirin EC 81 MG tablet Take 1 tablet (81 mg total) by mouth daily. Swallow whole. (Patient taking differently: Take 81 mg by mouth at bedtime. Swallow whole.)   cyanocobalamin (VITAMIN B12) 1000 MCG tablet Take 1,000 mcg by mouth daily.   DERMOTIC 0.01 % OIL Place 20 mLs in ear(s) as needed.   ergocalciferol (VITAMIN D2) 1.25 MG (50000 UT) capsule Take 50,000 Units by mouth every Monday. WITH LUNCH   ezetimibe (ZETIA) 10 MG tablet Take 1 tablet (  Cardiology Office Note:    Date:  02/05/2023   ID:  Victoria Ferguson, DOB [birth year], MRN [id number]  PCP:  Henrine Screws, MD   [facility] Providers Cardiologist:  Olga Millers, MD Cardiology APP:  Marcelino Duster, PA     Referring MD: Henrine Screws, MD   Chief Complaint  Patient presents with   Follow-up    CAD, HTN    History of Present Illness:    Victoria Ferguson is a 82 y.o. female with a hx of CAD, hypertension, hyperlipidemia.  He had a cardiac CTA December 2022 with elevated calcium score 2610 placing him in the 98th percentile with 25 to 49% left main, 50 to 69% LCx, occluded mid LAD with collaterals, 7090% RCA and PFO.  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  Interestingly FFR showed total occlusion but no other obstructive disease.  She was recommended for cardiac catheterization; however, platelet count had decreased to 50,000.  Hematology was consulted and platelets improved.  In discussion with Dr. Jens Som in March 2023, she preferred to wait until May for definitive angiography as she was moving.  I saw her in clinic 09/24/2021 to discuss definitive angiography.  Due to several stressful life events, she wanted to defer heart catheterization. She eventually had heart cath 12/19/21 which showed CTO of LAD with collaterals and moderate disease in the RCA. Medical therapy was recommended.   I saw her for post cath follow up 12/2021. We discussed low sodium diet (LVEDP elevated during cath) as she uses a lot of salt when cooking.   Given rise in sCr to 1.64, I stopped hydrochlorothiazide and reduced losartan to 25 mg. She complained of leg and arm pain and was still on 40 mg crestor despite rising creatinine. I recommended statin holiday and resumption of 10 mg crestor. Recheck of BMP showed sCr 1.07.  She returns for follow up. She states she is not as tired now and feels like her leg pain is better, but still with knee pain. Her appetite has improved, she continues  to cook country fried steak and eats sweets. She tries to be mindful of salt. Overall she is feeling better, with mild ankle edema, no compression stockings in place.    Past Medical History:  Diagnosis Date   Diabetes mellitus    Hyperlipemia    Hypertension    Hypothyroidism    Lung nodule     Past Surgical History:  Procedure Laterality Date   ABDOMINAL HYSTERECTOMY  04/27/1986   CARDIAC CATHETERIZATION     CATARACT EXTRACTION Bilateral    CHOLECYSTECTOMY  04/27/1988   LEFT HEART CATH AND CORONARY ANGIOGRAPHY N/A 12/19/2021   Procedure: LEFT HEART CATH AND CORONARY ANGIOGRAPHY;  Surgeon: Marykay Lex, MD;  Location: [facility] INVASIVE CV LAB;  Service: Cardiovascular;  Laterality: N/A;   TUBAL LIGATION  04/27/1972    Current Medications: Current Meds  Medication Sig   amLODipine (NORVASC) 2.5 MG tablet Take 1 tablet (2.5 mg total) by mouth daily.   aspirin EC 81 MG tablet Take 1 tablet (81 mg total) by mouth daily. Swallow whole. (Patient taking differently: Take 81 mg by mouth at bedtime. Swallow whole.)   cyanocobalamin (VITAMIN B12) 1000 MCG tablet Take 1,000 mcg by mouth daily.   DERMOTIC 0.01 % OIL Place 20 mLs in ear(s) as needed.   ergocalciferol (VITAMIN D2) 1.25 MG (50000 UT) capsule Take 50,000 Units by mouth every Monday. WITH LUNCH   ezetimibe (ZETIA) 10 MG tablet Take 1 tablet (  septal perforator.  After 20+ mm segment of occlusion, vessel fills via L-L collaterals from distal LCx-OM, and R-L collaterals from distal PDA-distal LAD and septal perforators to the LAD perforators. RCA has diffuse 40-60 and 50% stenoses in the proximal to mid segment.  (Per CTA, distal vessel FFR was borderline at 0.8, but mid vessel was only 0.5). Large-caliber codominant LCx with very large OM 1 that bifurcates (30% stenosis with OM 2-LPL 1 with ostial 40%. Hyperdynamic LV function EF > 65%, and minimally elevated LVEDP.   RECOMMENDATIONS Discharge home with plans for medical management.  Consider titrating up beta-blocker dose and potentially adding long-acting nitrate, amlodipine or Ranexa. If unable to control symptoms with medications, could consider segmental PCI of the RCA as this was the only vessel that was borderline FFR positive.  The diffuse moderate disease would likely require an extensive amount of stenting. LAD is flush occluded did not likely to be a candidate for CTO PCI.Marland Kitchen    Bryan Lemma, MD  Findings Coronary Findings Diagnostic  Dominance: Co-dominant  Left Anterior Descending There is mild diffuse disease throughout the vessel. The vessel is calcified. Collaterals Dist LAD filled by collaterals from RPDA.  Collaterals Dist LAD filled by collaterals from Lat 1st Mrg.  Prox LAD to Mid LAD lesion is 100%  stenosed.  First Septal Branch Vessel is small in size. Major branching septal perforator branch that provides most of the proximal septal perforation  Second Septal Branch Vessel is small in size. Collaterals 2nd Sept filled by collaterals from Inf Sept.  Collaterals 2nd Sept filled by collaterals from 1st Sept.  Third Septal Branch Vessel is small in size. Collaterals 3rd Sept filled by collaterals from Inf Sept.  Collaterals 3rd Sept filled by collaterals from 1st Sept.  Left Circumflex Vessel is large. Dist Cx lesion is 40% stenosed.  First Obtuse Marginal Branch Vessel is moderate in size. 1st Mrg lesion is 45% stenosed.  First Left Posterolateral Branch Vessel is large in size.  Second Left Posterolateral Branch Vessel is moderate in size.  Left Posterior Atrioventricular Artery Vessel is large in size.  Right Coronary Artery Vessel was injected. Vessel is normal in caliber. There is mild diffuse disease throughout the vessel. There is moderate focal disease in the vessel. Prox RCA-1 lesion is 40% stenosed. Prox RCA-2 lesion is 60% stenosed. Prox RCA to Mid RCA lesion is 50% stenosed. The lesion is type A, focal and eccentric.  Acute Marginal Branch Vessel is small in size.  Right Ventricular Branch Vessel is small in size.  Intervention  No interventions have been documented.   STRESS TESTS  MYOCARDIAL PERFUSION IMAGING 02/15/2015  Narrative  The left ventricular ejection fraction is hyperdynamic (>65%).  Nuclear stress EF: 82%.  There was no ST segment deviation noted during stress.  No T wave inversion was noted during stress.  Defect 1: There is a small defect of mild severity.  Small, mild fixed apical attenuation artifact. No significant reversible ischemia. LVEF 82% with normal wall motion. This is a low risk study.   ECHOCARDIOGRAM  ECHOCARDIOGRAM COMPLETE 04/07/2021  Narrative ECHOCARDIOGRAM REPORT    Patient Name:    Victoria Ferguson Date of Exam: 04/07/2021 Medical Rec #:  [id number]        Height:       60.0 in Accession #:    [id number]       Weight:       159.2 lb Date of Birth:  [birth year]  Cardiology Office Note:    Date:  02/05/2023   ID:  Victoria Ferguson, DOB [birth year], MRN [id number]  PCP:  Henrine Screws, MD   [facility] Providers Cardiologist:  Olga Millers, MD Cardiology APP:  Marcelino Duster, PA     Referring MD: Henrine Screws, MD   Chief Complaint  Patient presents with   Follow-up    CAD, HTN    History of Present Illness:    Victoria Ferguson is a 82 y.o. female with a hx of CAD, hypertension, hyperlipidemia.  He had a cardiac CTA December 2022 with elevated calcium score 2610 placing him in the 98th percentile with 25 to 49% left main, 50 to 69% LCx, occluded mid LAD with collaterals, 7090% RCA and PFO.  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  Interestingly FFR showed total occlusion but no other obstructive disease.  She was recommended for cardiac catheterization; however, platelet count had decreased to 50,000.  Hematology was consulted and platelets improved.  In discussion with Dr. Jens Som in March 2023, she preferred to wait until May for definitive angiography as she was moving.  I saw her in clinic 09/24/2021 to discuss definitive angiography.  Due to several stressful life events, she wanted to defer heart catheterization. She eventually had heart cath 12/19/21 which showed CTO of LAD with collaterals and moderate disease in the RCA. Medical therapy was recommended.   I saw her for post cath follow up 12/2021. We discussed low sodium diet (LVEDP elevated during cath) as she uses a lot of salt when cooking.   Given rise in sCr to 1.64, I stopped hydrochlorothiazide and reduced losartan to 25 mg. She complained of leg and arm pain and was still on 40 mg crestor despite rising creatinine. I recommended statin holiday and resumption of 10 mg crestor. Recheck of BMP showed sCr 1.07.  She returns for follow up. She states she is not as tired now and feels like her leg pain is better, but still with knee pain. Her appetite has improved, she continues  to cook country fried steak and eats sweets. She tries to be mindful of salt. Overall she is feeling better, with mild ankle edema, no compression stockings in place.    Past Medical History:  Diagnosis Date   Diabetes mellitus    Hyperlipemia    Hypertension    Hypothyroidism    Lung nodule     Past Surgical History:  Procedure Laterality Date   ABDOMINAL HYSTERECTOMY  04/27/1986   CARDIAC CATHETERIZATION     CATARACT EXTRACTION Bilateral    CHOLECYSTECTOMY  04/27/1988   LEFT HEART CATH AND CORONARY ANGIOGRAPHY N/A 12/19/2021   Procedure: LEFT HEART CATH AND CORONARY ANGIOGRAPHY;  Surgeon: Marykay Lex, MD;  Location: [facility] INVASIVE CV LAB;  Service: Cardiovascular;  Laterality: N/A;   TUBAL LIGATION  04/27/1972    Current Medications: Current Meds  Medication Sig   amLODipine (NORVASC) 2.5 MG tablet Take 1 tablet (2.5 mg total) by mouth daily.   aspirin EC 81 MG tablet Take 1 tablet (81 mg total) by mouth daily. Swallow whole. (Patient taking differently: Take 81 mg by mouth at bedtime. Swallow whole.)   cyanocobalamin (VITAMIN B12) 1000 MCG tablet Take 1,000 mcg by mouth daily.   DERMOTIC 0.01 % OIL Place 20 mLs in ear(s) as needed.   ergocalciferol (VITAMIN D2) 1.25 MG (50000 UT) capsule Take 50,000 Units by mouth every Monday. WITH LUNCH   ezetimibe (ZETIA) 10 MG tablet Take 1 tablet (  Cardiology Office Note:    Date:  02/05/2023   ID:  Victoria Ferguson, DOB [birth year], MRN [id number]  PCP:  Henrine Screws, MD   [facility] Providers Cardiologist:  Olga Millers, MD Cardiology APP:  Marcelino Duster, PA     Referring MD: Henrine Screws, MD   Chief Complaint  Patient presents with   Follow-up    CAD, HTN    History of Present Illness:    Victoria Ferguson is a 82 y.o. female with a hx of CAD, hypertension, hyperlipidemia.  He had a cardiac CTA December 2022 with elevated calcium score 2610 placing him in the 98th percentile with 25 to 49% left main, 50 to 69% LCx, occluded mid LAD with collaterals, 7090% RCA and PFO.  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  Interestingly FFR showed total occlusion but no other obstructive disease.  She was recommended for cardiac catheterization; however, platelet count had decreased to 50,000.  Hematology was consulted and platelets improved.  In discussion with Dr. Jens Som in March 2023, she preferred to wait until May for definitive angiography as she was moving.  I saw her in clinic 09/24/2021 to discuss definitive angiography.  Due to several stressful life events, she wanted to defer heart catheterization. She eventually had heart cath 12/19/21 which showed CTO of LAD with collaterals and moderate disease in the RCA. Medical therapy was recommended.   I saw her for post cath follow up 12/2021. We discussed low sodium diet (LVEDP elevated during cath) as she uses a lot of salt when cooking.   Given rise in sCr to 1.64, I stopped hydrochlorothiazide and reduced losartan to 25 mg. She complained of leg and arm pain and was still on 40 mg crestor despite rising creatinine. I recommended statin holiday and resumption of 10 mg crestor. Recheck of BMP showed sCr 1.07.  She returns for follow up. She states she is not as tired now and feels like her leg pain is better, but still with knee pain. Her appetite has improved, she continues  to cook country fried steak and eats sweets. She tries to be mindful of salt. Overall she is feeling better, with mild ankle edema, no compression stockings in place.    Past Medical History:  Diagnosis Date   Diabetes mellitus    Hyperlipemia    Hypertension    Hypothyroidism    Lung nodule     Past Surgical History:  Procedure Laterality Date   ABDOMINAL HYSTERECTOMY  04/27/1986   CARDIAC CATHETERIZATION     CATARACT EXTRACTION Bilateral    CHOLECYSTECTOMY  04/27/1988   LEFT HEART CATH AND CORONARY ANGIOGRAPHY N/A 12/19/2021   Procedure: LEFT HEART CATH AND CORONARY ANGIOGRAPHY;  Surgeon: Marykay Lex, MD;  Location: [facility] INVASIVE CV LAB;  Service: Cardiovascular;  Laterality: N/A;   TUBAL LIGATION  04/27/1972    Current Medications: Current Meds  Medication Sig   amLODipine (NORVASC) 2.5 MG tablet Take 1 tablet (2.5 mg total) by mouth daily.   aspirin EC 81 MG tablet Take 1 tablet (81 mg total) by mouth daily. Swallow whole. (Patient taking differently: Take 81 mg by mouth at bedtime. Swallow whole.)   cyanocobalamin (VITAMIN B12) 1000 MCG tablet Take 1,000 mcg by mouth daily.   DERMOTIC 0.01 % OIL Place 20 mLs in ear(s) as needed.   ergocalciferol (VITAMIN D2) 1.25 MG (50000 UT) capsule Take 50,000 Units by mouth every Monday. WITH LUNCH   ezetimibe (ZETIA) 10 MG tablet Take 1 tablet (  Cardiology Office Note:    Date:  02/05/2023   ID:  Victoria Ferguson, DOB [birth year], MRN [id number]  PCP:  Henrine Screws, MD   [facility] Providers Cardiologist:  Olga Millers, MD Cardiology APP:  Marcelino Duster, PA     Referring MD: Henrine Screws, MD   Chief Complaint  Patient presents with   Follow-up    CAD, HTN    History of Present Illness:    Victoria Ferguson is a 82 y.o. female with a hx of CAD, hypertension, hyperlipidemia.  He had a cardiac CTA December 2022 with elevated calcium score 2610 placing him in the 98th percentile with 25 to 49% left main, 50 to 69% LCx, occluded mid LAD with collaterals, 7090% RCA and PFO.  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  Interestingly FFR showed total occlusion but no other obstructive disease.  She was recommended for cardiac catheterization; however, platelet count had decreased to 50,000.  Hematology was consulted and platelets improved.  In discussion with Dr. Jens Som in March 2023, she preferred to wait until May for definitive angiography as she was moving.  I saw her in clinic 09/24/2021 to discuss definitive angiography.  Due to several stressful life events, she wanted to defer heart catheterization. She eventually had heart cath 12/19/21 which showed CTO of LAD with collaterals and moderate disease in the RCA. Medical therapy was recommended.   I saw her for post cath follow up 12/2021. We discussed low sodium diet (LVEDP elevated during cath) as she uses a lot of salt when cooking.   Given rise in sCr to 1.64, I stopped hydrochlorothiazide and reduced losartan to 25 mg. She complained of leg and arm pain and was still on 40 mg crestor despite rising creatinine. I recommended statin holiday and resumption of 10 mg crestor. Recheck of BMP showed sCr 1.07.  She returns for follow up. She states she is not as tired now and feels like her leg pain is better, but still with knee pain. Her appetite has improved, she continues  to cook country fried steak and eats sweets. She tries to be mindful of salt. Overall she is feeling better, with mild ankle edema, no compression stockings in place.    Past Medical History:  Diagnosis Date   Diabetes mellitus    Hyperlipemia    Hypertension    Hypothyroidism    Lung nodule     Past Surgical History:  Procedure Laterality Date   ABDOMINAL HYSTERECTOMY  04/27/1986   CARDIAC CATHETERIZATION     CATARACT EXTRACTION Bilateral    CHOLECYSTECTOMY  04/27/1988   LEFT HEART CATH AND CORONARY ANGIOGRAPHY N/A 12/19/2021   Procedure: LEFT HEART CATH AND CORONARY ANGIOGRAPHY;  Surgeon: Marykay Lex, MD;  Location: [facility] INVASIVE CV LAB;  Service: Cardiovascular;  Laterality: N/A;   TUBAL LIGATION  04/27/1972    Current Medications: Current Meds  Medication Sig   amLODipine (NORVASC) 2.5 MG tablet Take 1 tablet (2.5 mg total) by mouth daily.   aspirin EC 81 MG tablet Take 1 tablet (81 mg total) by mouth daily. Swallow whole. (Patient taking differently: Take 81 mg by mouth at bedtime. Swallow whole.)   cyanocobalamin (VITAMIN B12) 1000 MCG tablet Take 1,000 mcg by mouth daily.   DERMOTIC 0.01 % OIL Place 20 mLs in ear(s) as needed.   ergocalciferol (VITAMIN D2) 1.25 MG (50000 UT) capsule Take 50,000 Units by mouth every Monday. WITH LUNCH   ezetimibe (ZETIA) 10 MG tablet Take 1 tablet (  septal perforator.  After 20+ mm segment of occlusion, vessel fills via L-L collaterals from distal LCx-OM, and R-L collaterals from distal PDA-distal LAD and septal perforators to the LAD perforators. RCA has diffuse 40-60 and 50% stenoses in the proximal to mid segment.  (Per CTA, distal vessel FFR was borderline at 0.8, but mid vessel was only 0.5). Large-caliber codominant LCx with very large OM 1 that bifurcates (30% stenosis with OM 2-LPL 1 with ostial 40%. Hyperdynamic LV function EF > 65%, and minimally elevated LVEDP.   RECOMMENDATIONS Discharge home with plans for medical management.  Consider titrating up beta-blocker dose and potentially adding long-acting nitrate, amlodipine or Ranexa. If unable to control symptoms with medications, could consider segmental PCI of the RCA as this was the only vessel that was borderline FFR positive.  The diffuse moderate disease would likely require an extensive amount of stenting. LAD is flush occluded did not likely to be a candidate for CTO PCI.Marland Kitchen    Bryan Lemma, MD  Findings Coronary Findings Diagnostic  Dominance: Co-dominant  Left Anterior Descending There is mild diffuse disease throughout the vessel. The vessel is calcified. Collaterals Dist LAD filled by collaterals from RPDA.  Collaterals Dist LAD filled by collaterals from Lat 1st Mrg.  Prox LAD to Mid LAD lesion is 100%  stenosed.  First Septal Branch Vessel is small in size. Major branching septal perforator branch that provides most of the proximal septal perforation  Second Septal Branch Vessel is small in size. Collaterals 2nd Sept filled by collaterals from Inf Sept.  Collaterals 2nd Sept filled by collaterals from 1st Sept.  Third Septal Branch Vessel is small in size. Collaterals 3rd Sept filled by collaterals from Inf Sept.  Collaterals 3rd Sept filled by collaterals from 1st Sept.  Left Circumflex Vessel is large. Dist Cx lesion is 40% stenosed.  First Obtuse Marginal Branch Vessel is moderate in size. 1st Mrg lesion is 45% stenosed.  First Left Posterolateral Branch Vessel is large in size.  Second Left Posterolateral Branch Vessel is moderate in size.  Left Posterior Atrioventricular Artery Vessel is large in size.  Right Coronary Artery Vessel was injected. Vessel is normal in caliber. There is mild diffuse disease throughout the vessel. There is moderate focal disease in the vessel. Prox RCA-1 lesion is 40% stenosed. Prox RCA-2 lesion is 60% stenosed. Prox RCA to Mid RCA lesion is 50% stenosed. The lesion is type A, focal and eccentric.  Acute Marginal Branch Vessel is small in size.  Right Ventricular Branch Vessel is small in size.  Intervention  No interventions have been documented.   STRESS TESTS  MYOCARDIAL PERFUSION IMAGING 02/15/2015  Narrative  The left ventricular ejection fraction is hyperdynamic (>65%).  Nuclear stress EF: 82%.  There was no ST segment deviation noted during stress.  No T wave inversion was noted during stress.  Defect 1: There is a small defect of mild severity.  Small, mild fixed apical attenuation artifact. No significant reversible ischemia. LVEF 82% with normal wall motion. This is a low risk study.   ECHOCARDIOGRAM  ECHOCARDIOGRAM COMPLETE 04/07/2021  Narrative ECHOCARDIOGRAM REPORT    Patient Name:    Victoria Ferguson Date of Exam: 04/07/2021 Medical Rec #:  [id number]        Height:       60.0 in Accession #:    [id number]       Weight:       159.2 lb Date of Birth:  [birth year]

## 2023-02-05 ENCOUNTER — Ambulatory Visit: Payer: Medicare Other | Attending: Physician Assistant | Admitting: Physician Assistant

## 2023-02-05 ENCOUNTER — Encounter: Payer: Self-pay | Admitting: Physician Assistant

## 2023-02-05 VITALS — BP 155/70 | HR 57 | Ht <= 58 in | Wt 151.8 lb

## 2023-02-05 DIAGNOSIS — I2510 Atherosclerotic heart disease of native coronary artery without angina pectoris: Secondary | ICD-10-CM

## 2023-02-05 DIAGNOSIS — I10 Essential (primary) hypertension: Secondary | ICD-10-CM

## 2023-02-05 DIAGNOSIS — E785 Hyperlipidemia, unspecified: Secondary | ICD-10-CM

## 2023-02-05 DIAGNOSIS — N1831 Chronic kidney disease, stage 3a: Secondary | ICD-10-CM

## 2023-02-05 MED ORDER — AMLODIPINE BESYLATE 2.5 MG PO TABS
2.5000 mg | ORAL_TABLET | Freq: Every day | ORAL | 3 refills | Status: DC
Start: 2023-02-05 — End: 2023-11-30

## 2023-02-05 MED ORDER — NITROGLYCERIN 0.4 MG SL SUBL: 0.4000 mg | SUBLINGUAL_TABLET | SUBLINGUAL | 3 refills | Status: AC | PRN

## 2023-02-05 NOTE — Patient Instructions (Addendum)
Medication Instructions:  Start Amlodipine 2.5 mg daily   *If you need a refill on your cardiac medications before your next appointment, please call your pharmacy*   Lab Work: NONE ordered at this time of appointment    Testing/Procedures: NONE ordered at this time of appointment     Follow-Up: At [facility], you and your health needs are our priority.  As part of our continuing mission to provide you with exceptional heart care, we have created designated Provider Care Teams.  These Care Teams include your primary Cardiologist (physician) and Advanced Practice Providers (APPs -  Physician Assistants and Nurse Practitioners) who all work together to provide you with the care you need, when you need it.  We recommend signing up for the patient portal called "MyChart".  Sign up information is provided on this After Visit Summary.  MyChart is used to connect with patients for Virtual Visits (Telemedicine).  Patients are able to view lab/test results, encounter notes, upcoming appointments, etc.  Non-urgent messages can be sent to your provider as well.   To learn more about what you can do with MyChart, go to [email or web address].    Your next appointment:    Keep follow up   Provider:   Micah Flesher, PA-C        Other Instructions Monitor blood pressure.

## 2023-02-18 ENCOUNTER — Encounter: Payer: Self-pay | Admitting: Podiatry

## 2023-02-18 ENCOUNTER — Ambulatory Visit (INDEPENDENT_AMBULATORY_CARE_PROVIDER_SITE_OTHER): Payer: Medicare Other | Admitting: Podiatry

## 2023-02-18 DIAGNOSIS — M79674 Pain in right toe(s): Secondary | ICD-10-CM | POA: Diagnosis not present

## 2023-02-18 DIAGNOSIS — M79675 Pain in left toe(s): Secondary | ICD-10-CM

## 2023-02-18 DIAGNOSIS — B351 Tinea unguium: Secondary | ICD-10-CM

## 2023-02-21 ENCOUNTER — Encounter: Payer: Self-pay | Admitting: Podiatry

## 2023-02-21 NOTE — Progress Notes (Signed)
Subjective:  Patient ID: Victoria Ferguson, female    DOB: 22-Feb-1941,  MRN: [id number]  Chief Complaint  Patient presents with   Debridement    Requesting toenail trim - has a long nail that is sore and can't trim herself, diabetic - last A1c was "around 7", patient states on a blood thinner-only see aspirin in her meds   New Patient (Initial Visit)    82 y.o. female presents with the above complaint. History confirmed with patient.   Objective:  Physical Exam: warm, good capillary refill, no trophic changes or ulcerative lesions, normal DP and PT pulses, and normal sensory exam. Left Foot: dystrophic yellowed discolored nail plates with subungual debris Right Foot: dystrophic yellowed discolored nail plates with subungual debris   Assessment:   1. Pain due to onychomycosis of toenails of both feet      Plan:  Patient was evaluated and treated and all questions answered.   Discussed the etiology and treatment options for the condition in detail with the patient.  Recommended debridement of the nails today. Sharp and mechanical debridement performed of all painful and mycotic nails today. Nails debrided in length and thickness using a nail nipper to level of comfort. Discussed treatment options including appropriate shoe gear. Follow up as needed for painful nails.  Patient educated on diabetes. Discussed proper diabetic foot care and discussed risks and complications of disease. Educated patient in depth on reasons to return to the office immediately should he/she discover anything concerning or new on the feet. All questions answered. Discussed proper shoes as well.    Return in about 3 months (around 05/21/2023) for at risk diabetic foot care.

## 2023-03-02 ENCOUNTER — Encounter: Payer: Self-pay | Admitting: Medical Oncology

## 2023-03-02 ENCOUNTER — Inpatient Hospital Stay: Payer: Medicare Other | Attending: Medical Oncology

## 2023-03-02 ENCOUNTER — Inpatient Hospital Stay (HOSPITAL_BASED_OUTPATIENT_CLINIC_OR_DEPARTMENT_OTHER): Payer: Medicare Other | Admitting: Medical Oncology

## 2023-03-02 VITALS — BP 124/62 | HR 68 | Temp 98.1°F | Resp 18 | Wt 150.0 lb

## 2023-03-02 DIAGNOSIS — Z9109 Other allergy status, other than to drugs and biological substances: Secondary | ICD-10-CM | POA: Insufficient documentation

## 2023-03-02 DIAGNOSIS — L719 Rosacea, unspecified: Secondary | ICD-10-CM | POA: Insufficient documentation

## 2023-03-02 DIAGNOSIS — Z888 Allergy status to other drugs, medicaments and biological substances status: Secondary | ICD-10-CM | POA: Diagnosis not present

## 2023-03-02 DIAGNOSIS — E785 Hyperlipidemia, unspecified: Secondary | ICD-10-CM | POA: Diagnosis not present

## 2023-03-02 DIAGNOSIS — E1169 Type 2 diabetes mellitus with other specified complication: Secondary | ICD-10-CM | POA: Insufficient documentation

## 2023-03-02 DIAGNOSIS — D696 Thrombocytopenia, unspecified: Secondary | ICD-10-CM

## 2023-03-02 DIAGNOSIS — G63 Polyneuropathy in diseases classified elsewhere: Secondary | ICD-10-CM | POA: Insufficient documentation

## 2023-03-02 DIAGNOSIS — D75839 Thrombocytosis, unspecified: Secondary | ICD-10-CM | POA: Insufficient documentation

## 2023-03-02 DIAGNOSIS — R822 Biliuria: Secondary | ICD-10-CM | POA: Insufficient documentation

## 2023-03-02 DIAGNOSIS — N1831 Chronic kidney disease, stage 3a: Secondary | ICD-10-CM | POA: Diagnosis not present

## 2023-03-02 DIAGNOSIS — I129 Hypertensive chronic kidney disease with stage 1 through stage 4 chronic kidney disease, or unspecified chronic kidney disease: Secondary | ICD-10-CM | POA: Insufficient documentation

## 2023-03-02 DIAGNOSIS — E1122 Type 2 diabetes mellitus with diabetic chronic kidney disease: Secondary | ICD-10-CM | POA: Insufficient documentation

## 2023-03-02 DIAGNOSIS — K219 Gastro-esophageal reflux disease without esophagitis: Secondary | ICD-10-CM | POA: Insufficient documentation

## 2023-03-02 DIAGNOSIS — Z79899 Other long term (current) drug therapy: Secondary | ICD-10-CM | POA: Diagnosis not present

## 2023-03-02 DIAGNOSIS — F419 Anxiety disorder, unspecified: Secondary | ICD-10-CM | POA: Insufficient documentation

## 2023-03-02 DIAGNOSIS — E7800 Pure hypercholesterolemia, unspecified: Secondary | ICD-10-CM | POA: Insufficient documentation

## 2023-03-02 DIAGNOSIS — H9193 Unspecified hearing loss, bilateral: Secondary | ICD-10-CM | POA: Insufficient documentation

## 2023-03-02 DIAGNOSIS — M810 Age-related osteoporosis without current pathological fracture: Secondary | ICD-10-CM | POA: Insufficient documentation

## 2023-03-02 DIAGNOSIS — Z887 Allergy status to serum and vaccine status: Secondary | ICD-10-CM | POA: Insufficient documentation

## 2023-03-02 DIAGNOSIS — K5900 Constipation, unspecified: Secondary | ICD-10-CM | POA: Insufficient documentation

## 2023-03-02 DIAGNOSIS — N183 Chronic kidney disease, stage 3 unspecified: Secondary | ICD-10-CM | POA: Insufficient documentation

## 2023-03-02 DIAGNOSIS — M159 Polyosteoarthritis, unspecified: Secondary | ICD-10-CM | POA: Insufficient documentation

## 2023-03-02 DIAGNOSIS — E559 Vitamin D deficiency, unspecified: Secondary | ICD-10-CM | POA: Insufficient documentation

## 2023-03-02 LAB — CMP (CANCER CENTER ONLY)
ALT: 13 U/L (ref 0–44)
AST: 29 U/L (ref 15–41)
Albumin: 4.1 g/dL (ref 3.5–5.0)
Alkaline Phosphatase: 60 U/L (ref 38–126)
Anion gap: 9 (ref 5–15)
BUN: 17 mg/dL (ref 8–23)
CO2: 28 mmol/L (ref 22–32)
Calcium: 10 mg/dL (ref 8.9–10.3)
Chloride: 105 mmol/L (ref 98–111)
Creatinine: 1.07 mg/dL — ABNORMAL HIGH (ref 0.44–1.00)
GFR, Estimated: 52 mL/min — ABNORMAL LOW (ref >60)
Glucose, Bld: 104 mg/dL — ABNORMAL HIGH (ref 70–99)
Potassium: 4.1 mmol/L (ref 3.5–5.1)
Sodium: 142 mmol/L (ref 135–145)
Total Bilirubin: 1.3 mg/dL — ABNORMAL HIGH (ref <1.2)
Total Protein: 7.1 g/dL (ref 6.5–8.1)

## 2023-03-02 LAB — CBC
HCT: 38.9 % (ref 36.0–46.0)
Hemoglobin: 12.4 g/dL (ref 12.0–15.0)
MCH: 28.7 pg (ref 26.0–34.0)
MCHC: 31.9 g/dL (ref 30.0–36.0)
MCV: 90 fL (ref 80.0–100.0)
Platelets: 87 10*3/uL — ABNORMAL LOW (ref 150–400)
RBC: 4.32 MIL/uL (ref 3.87–5.11)
RDW: 15.3 % (ref 11.5–15.5)
WBC: 4.7 10*3/uL (ref 4.0–10.5)
nRBC: 0 % (ref 0.0–0.2)

## 2023-03-02 LAB — SAVE SMEAR(SSMR), FOR PROVIDER SLIDE REVIEW

## 2023-03-02 LAB — IRON AND IRON BINDING CAPACITY (CC-WL,HP ONLY)
Iron: 76 ug/dL (ref 28–170)
Saturation Ratios: 20 % (ref 10.4–31.8)
TIBC: 382 ug/dL (ref 250–450)
UIBC: 306 ug/dL (ref 148–442)

## 2023-03-02 LAB — C-REACTIVE PROTEIN: CRP: <0.5 mg/dL (ref <1.0)

## 2023-03-02 LAB — FOLATE: Folate: 33.3 ng/mL (ref >5.9)

## 2023-03-02 LAB — FERRITIN: Ferritin: 58 ng/mL (ref 11–307)

## 2023-03-02 LAB — SEDIMENTATION RATE: Sed Rate: 25 mm/h — ABNORMAL HIGH (ref 0–22)

## 2023-03-02 LAB — VITAMIN B12: Vitamin B-12: 661 pg/mL (ref 180–914)

## 2023-03-02 NOTE — Progress Notes (Signed)
Hematology and Oncology Follow Up Visit  Victoria Ferguson [id number] 03/02/1941 82 y.o. 03/02/2023  Past Medical History:  Diagnosis Date   Diabetes mellitus    Hyperlipemia    Hypertension    Hypothyroidism    Lung nodule     Principle Diagnosis:  Thrombocytopenia  Current Therapy:   Observation  PriorTherapy:   None    Interim History:  Victoria Ferguson is back for follow-up for Victoria thrombocytopenia.   Today she reports that she is doing ok overall. At Victoria last visit we had Victoria supplement with B12 and folate.   She continues to be followed closely by Victoria many specialists. She sees both cardiology and PCP. PCP has made some BP medication adjustments recently. She is not currently seeing a nephrologist- has seen them in the past. She does have CKD with preserved EPO levels.   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   She has history of CAD- too occluded to stent- bypass grafting deferred due to Victoria caretaker status.   In terms of Victoria thrombocytopenia she has not noticed any recent infections nor has she had any bleeding episodes or bruising events. She is not on blood thinners. No upcoming procedures.      Wt Readings from Last 3 Encounters:  03/02/23 150 lb 0.6 oz (68.1 kg)  02/05/23 151 lb 12.8 oz (68.9 kg)  01/06/23 148 lb 12.8 oz (67.5 kg)     Medications:   Current Outpatient Medications:    amLODipine (NORVASC) 2.5 MG tablet, Take 1 tablet (2.5 mg total) by mouth daily., Disp: 90 tablet, Rfl: 3   aspirin EC 81 MG tablet, Take 1 tablet (81 mg total) by mouth daily. Swallow whole. (Patient taking differently: Take 81 mg by mouth at bedtime. Swallow whole.), Disp: 90 tablet, Rfl: 3   cyanocobalamin (VITAMIN B12) 1000 MCG tablet, Take 1,000 mcg by mouth daily., Disp: , Rfl:    DERMOTIC 0.01 % OIL, Place 20 mLs in ear(s) as needed., Disp: , Rfl:    ergocalciferol (VITAMIN D2) 1.25 MG (50000 UT) capsule, Take 50,000 Units by mouth every Monday. WITH LUNCH,  Disp: , Rfl:    ezetimibe (ZETIA) 10 MG tablet, Take 1 tablet (10 mg total) by mouth daily., Disp: 90 tablet, Rfl: 3   ferrous sulfate 325 (65 FE) MG tablet, Take 325 mg by mouth every 3 (three) days., Disp: , Rfl:    folic acid (FOLVITE) 1 MG tablet, Take 1 tablet (1 mg total) by mouth daily., Disp: 30 tablet, Rfl: 3   FREESTYLE LITE test strip, , Disp: , Rfl:    glipiZIDE (GLUCOTROL) 5 MG tablet, Take 5 mg by mouth daily with lunch., Disp: , Rfl:    levothyroxine (SYNTHROID) 75 MCG tablet, Take 75 mcg by mouth daily., Disp: , Rfl:    losartan (COZAAR) 50 MG tablet, Take 25 mg by mouth daily with lunch. Take 1/2 Tablet Daily, Disp: , Rfl:    metoprolol succinate (TOPROL-XL) 25 MG 24 hr tablet, TAKE 1 TABLET(25 MG) BY MOUTH DAILY, Disp: 90 tablet, Rfl: 3   nitroGLYCERIN (NITROSTAT) 0.4 MG SL tablet, Place 1 tablet (0.4 mg total) under the tongue every 5 (five) minutes as needed for chest pain., Disp: 25 tablet, Rfl: 3   rosuvastatin (CRESTOR) 10 MG tablet, Take 1 tablet (10 mg total) by mouth daily., Disp: 90 tablet, Rfl: 3  Allergies:  Allergies  Allergen Reactions   Cat Hair Extract Other (See Comments)  CATS- Sneezing/eye watering   Iohexol Hives     Desc: HIVES W/ IV CONTRAST 20 YRS AGO/A.C.PRE-MED W/ PREDNISONE AND BENADRYAL OK-05/14/05   Code: HIVES, Desc: HIVES FROM IV DYE IN 1988    Tetanus-Diphth-Acell Pertussis Swelling    Other reaction(s): redness/swelling at inj. site   Latex Itching and Rash   Tape Rash    Past Medical History, Surgical history, Social history, and Family History were reviewed and updated.  Review of Systems: Review of Systems  Constitutional:  Negative for appetite change, fatigue, fever and unexpected weight change.  HENT:   Negative for nosebleeds.   Respiratory:  Negative for cough, hemoptysis and shortness of breath.   Cardiovascular:  Negative for chest pain.  Gastrointestinal:  Negative for blood in stool.  Endocrine: Negative for hot  flashes.  Genitourinary:  Negative for hematuria.   Hematological:  Does not bruise/bleed easily.     Physical Exam:  weight is 150 lb 0.6 oz (68.1 kg). Victoria oral temperature is 98.1 F (36.7 C). Victoria blood pressure is 124/62 and Victoria pulse is 68. Victoria respiration is 18 and oxygen saturation is 100%.   Physical Exam Vitals and nursing note reviewed.  Constitutional:      General: She is not in acute distress.    Appearance: Normal appearance. She is not ill-appearing, toxic-appearing or diaphoretic.  HENT:     Head: Normocephalic and atraumatic.  Eyes:     General: No scleral icterus.    Conjunctiva/sclera: Conjunctivae normal.  Cardiovascular:     Rate and Rhythm: Normal rate and regular rhythm.     Heart sounds: Normal heart sounds.  Pulmonary:     Effort: Pulmonary effort is normal.     Breath sounds: Normal breath sounds.  Abdominal:     Palpations: Abdomen is soft.     Comments: No frank enlargement of spleen or liver  Musculoskeletal:     Cervical back: Neck supple.  Lymphadenopathy:     Cervical: No cervical adenopathy.  Skin:    Coloration: Skin is not jaundiced or pale.     Findings: No erythema or rash.  Neurological:     Mental Status: She is alert.       Lab Results  Component Value Date   WBC 4.7 03/02/2023   HGB 12.4 03/02/2023   HCT 38.9 03/02/2023   MCV 90.0 03/02/2023   PLT 87 (L) 03/02/2023     Chemistry      Component Value Date/Time   NA 140 02/01/2023 0955   K 4.0 02/01/2023 0955   CL 106 02/01/2023 0955   CO2 23 02/01/2023 0955   BUN 16 02/01/2023 0955   CREATININE 1.07 (H) 02/01/2023 0955   CREATININE 1.64 (H) 12/01/2022 1234      Component Value Date/Time   CALCIUM 9.3 02/01/2023 0955   ALKPHOS 49 12/01/2022 1234   AST 34 12/01/2022 1234   ALT 17 12/01/2022 1234   BILITOT 1.2 12/01/2022 1234      Impression and Plan: [name] is a 82 year old female who presents with Thrombocytopenia. She is currently taking folic acid and  B12 supplementation.   Victoria platelets continue to trend down slowly. CRP and sed rate added to labs today. Blood smear reviewed with Dr. Myna Hidalgo today which showed normal appearing platelets- likely mild age related proliferative issue. Our plan is to continue to observe unless she starts to have additional cell line changes or a significant drop in Victoria platelets over a short period of  time.   RTC 3 months APP, labs (CBC w/, CMP, iron, ferritin, B12, folate)   Clent Jacks PA-C 11/5/202412:06 PM

## 2023-03-03 ENCOUNTER — Other Ambulatory Visit: Payer: Self-pay | Admitting: Medical Oncology

## 2023-03-03 DIAGNOSIS — D696 Thrombocytopenia, unspecified: Secondary | ICD-10-CM

## 2023-04-01 ENCOUNTER — Encounter: Payer: Self-pay | Admitting: Cardiology

## 2023-04-02 MED ORDER — LOSARTAN POTASSIUM 25 MG PO TABS: 25.0000 mg | ORAL_TABLET | Freq: Every day | ORAL | 3 refills | Status: DC

## 2023-04-08 ENCOUNTER — Other Ambulatory Visit: Payer: Self-pay

## 2023-04-08 MED ORDER — FOLIC ACID 1 MG PO TABS: 1.0000 mg | ORAL_TABLET | Freq: Every day | ORAL | 3 refills | Status: DC

## 2023-05-20 ENCOUNTER — Ambulatory Visit: Payer: Medicare Other | Admitting: Podiatry

## 2023-06-02 ENCOUNTER — Inpatient Hospital Stay: Payer: Medicare Other | Attending: Medical Oncology

## 2023-06-02 ENCOUNTER — Inpatient Hospital Stay (HOSPITAL_BASED_OUTPATIENT_CLINIC_OR_DEPARTMENT_OTHER): Payer: Medicare Other | Admitting: Medical Oncology

## 2023-06-02 ENCOUNTER — Other Ambulatory Visit: Payer: Self-pay | Admitting: Medical Oncology

## 2023-06-02 VITALS — BP 166/65 | HR 65 | Temp 97.4°F | Resp 16 | Ht <= 58 in | Wt 156.0 lb

## 2023-06-02 DIAGNOSIS — Z79899 Other long term (current) drug therapy: Secondary | ICD-10-CM | POA: Diagnosis not present

## 2023-06-02 DIAGNOSIS — E785 Hyperlipidemia, unspecified: Secondary | ICD-10-CM | POA: Insufficient documentation

## 2023-06-02 DIAGNOSIS — E1122 Type 2 diabetes mellitus with diabetic chronic kidney disease: Secondary | ICD-10-CM | POA: Insufficient documentation

## 2023-06-02 DIAGNOSIS — Z888 Allergy status to other drugs, medicaments and biological substances status: Secondary | ICD-10-CM | POA: Diagnosis not present

## 2023-06-02 DIAGNOSIS — N183 Chronic kidney disease, stage 3 unspecified: Secondary | ICD-10-CM | POA: Insufficient documentation

## 2023-06-02 DIAGNOSIS — I129 Hypertensive chronic kidney disease with stage 1 through stage 4 chronic kidney disease, or unspecified chronic kidney disease: Secondary | ICD-10-CM | POA: Insufficient documentation

## 2023-06-02 DIAGNOSIS — E039 Hypothyroidism, unspecified: Secondary | ICD-10-CM | POA: Insufficient documentation

## 2023-06-02 DIAGNOSIS — D696 Thrombocytopenia, unspecified: Secondary | ICD-10-CM | POA: Diagnosis not present

## 2023-06-02 DIAGNOSIS — Z887 Allergy status to serum and vaccine status: Secondary | ICD-10-CM | POA: Diagnosis not present

## 2023-06-02 LAB — CBC WITH DIFFERENTIAL (CANCER CENTER ONLY)
Abs Immature Granulocytes: 0.03 10*3/uL (ref 0.00–0.07)
Basophils Absolute: 0 10*3/uL (ref 0.0–0.1)
Basophils Relative: 0 %
Eosinophils Absolute: 0.1 10*3/uL (ref 0.0–0.5)
Eosinophils Relative: 2 %
HCT: 38.2 % (ref 36.0–46.0)
Hemoglobin: 12.5 g/dL (ref 12.0–15.0)
Immature Granulocytes: 1 %
Lymphocytes Relative: 22 %
Lymphs Abs: 1.1 10*3/uL (ref 0.7–4.0)
MCH: 29.4 pg (ref 26.0–34.0)
MCHC: 32.7 g/dL (ref 30.0–36.0)
MCV: 89.9 fL (ref 80.0–100.0)
Monocytes Absolute: 0.4 10*3/uL (ref 0.1–1.0)
Monocytes Relative: 8 %
Neutro Abs: 3.3 10*3/uL (ref 1.7–7.7)
Neutrophils Relative %: 67 %
Platelet Count: 94 10*3/uL — ABNORMAL LOW (ref 150–400)
RBC: 4.25 MIL/uL (ref 3.87–5.11)
RDW: 14.9 % (ref 11.5–15.5)
WBC Count: 4.9 10*3/uL (ref 4.0–10.5)
nRBC: 0 % (ref 0.0–0.2)

## 2023-06-02 LAB — CMP (CANCER CENTER ONLY)
ALT: 15 U/L (ref 0–44)
AST: 32 U/L (ref 15–41)
Albumin: 4 g/dL (ref 3.5–5.0)
Alkaline Phosphatase: 79 U/L (ref 38–126)
Anion gap: 8 (ref 5–15)
BUN: 15 mg/dL (ref 8–23)
CO2: 28 mmol/L (ref 22–32)
Calcium: 9.8 mg/dL (ref 8.9–10.3)
Chloride: 106 mmol/L (ref 98–111)
Creatinine: 1.01 mg/dL — ABNORMAL HIGH (ref 0.44–1.00)
GFR, Estimated: 56 mL/min — ABNORMAL LOW (ref >60)
Glucose, Bld: 66 mg/dL — ABNORMAL LOW (ref 70–99)
Potassium: 4.1 mmol/L (ref 3.5–5.1)
Sodium: 142 mmol/L (ref 135–145)
Total Bilirubin: 0.9 mg/dL (ref 0.0–1.2)
Total Protein: 7.2 g/dL (ref 6.5–8.1)

## 2023-06-02 LAB — RETIC PANEL
Immature Retic Fract: 12.8 % (ref 2.3–15.9)
RBC.: 4.34 MIL/uL (ref 3.87–5.11)
Retic Count, Absolute: 85.5 10*3/uL (ref 19.0–186.0)
Retic Ct Pct: 2 % (ref 0.4–3.1)
Reticulocyte Hemoglobin: 32.6 pg (ref >27.9)

## 2023-06-02 LAB — SAVE SMEAR(SSMR), FOR PROVIDER SLIDE REVIEW

## 2023-06-02 LAB — FOLATE: Folate: 33.6 ng/mL (ref >5.9)

## 2023-06-02 LAB — LACTATE DEHYDROGENASE: LDH: 203 U/L — ABNORMAL HIGH (ref 98–192)

## 2023-06-02 LAB — VITAMIN B12: Vitamin B-12: 781 pg/mL (ref 180–914)

## 2023-06-02 LAB — IRON AND IRON BINDING CAPACITY (CC-WL,HP ONLY)
Iron: 83 ug/dL (ref 28–170)
Saturation Ratios: 21 % (ref 10.4–31.8)
TIBC: 403 ug/dL (ref 250–450)
UIBC: 320 ug/dL (ref 148–442)

## 2023-06-02 LAB — FERRITIN: Ferritin: 55 ng/mL (ref 11–307)

## 2023-06-02 LAB — SEDIMENTATION RATE: Sed Rate: 17 mm/h (ref 0–22)

## 2023-06-02 NOTE — Progress Notes (Signed)
 Hematology and Oncology Follow Up Visit  Victoria Ferguson [id number] [birth year] 83 y.o. 06/02/2023  Past Medical History:  Diagnosis Date   Diabetes mellitus    Hyperlipemia    Hypertension    Hypothyroidism    Lung nodule     Principle Diagnosis:  Thrombocytopenia  Current Therapy:   B12 1,000 mg daily Folate 1 mg BID  PriorTherapy:   None    Interim History:  Victoria Ferguson is back for follow-up for Victoria thrombocytopenia.   She states that she is doing ok. She is still taking care of Victoria Ferguson who is bed bound.   There has been no bleeding to Victoria knowledge: denies epistaxis, gingivitis, hemoptysis, hematemesis, hematuria, melena, excessive bruising, blood donation.   She continues to be followed closely by Victoria many specialists. She sees both cardiology and PCP. PCP has made some BP medication adjustments recently. She is not currently seeing a nephrologist- has seen them in the past. She does have CKD with preserved EPO levels.   She has history of CAD- too occluded to stent- bypass grafting deferred due to Victoria caretaker status.   In terms of Victoria thrombocytopenia she has not noticed any recent infections nor has she had any bleeding episodes or bruising events. She is not on blood thinners. No upcoming procedures.      Wt Readings from Last 3 Encounters:  06/02/23 156 lb (70.8 kg)  03/02/23 150 lb 0.6 oz (68.1 kg)  02/05/23 151 lb 12.8 oz (68.9 kg)     Medications:   Current Outpatient Medications:    amLODipine  (NORVASC ) 2.5 MG tablet, Take 1 tablet (2.5 mg total) by mouth daily., Disp: 90 tablet, Rfl: 3   aspirin  EC 81 MG tablet, Take 1 tablet (81 mg total) by mouth daily. Swallow whole. (Patient taking differently: Take 81 mg by mouth at bedtime. Swallow whole.), Disp: 90 tablet, Rfl: 3   cyanocobalamin  (VITAMIN B12) 1000 MCG tablet, Take 1,000 mcg by mouth daily., Disp: , Rfl:    DERMOTIC 0.01 % OIL, Place 20 mLs in ear(s) as needed., Disp: , Rfl:    ergocalciferol  (VITAMIN D2) 1.25 MG (50000 UT) capsule, Take 50,000 Units by mouth every Monday. WITH LUNCH, Disp: , Rfl:    ezetimibe  (ZETIA ) 10 MG tablet, Take 1 tablet (10 mg total) by mouth daily., Disp: 90 tablet, Rfl: 3   ferrous sulfate 325 (65 FE) MG tablet, Take 325 mg by mouth every 3 (three) days., Disp: , Rfl:    folic acid  (FOLVITE ) 1 MG tablet, Take 1 tablet (1 mg total) by mouth daily., Disp: 30 tablet, Rfl: 3   FREESTYLE LITE test strip, , Disp: , Rfl:    glipiZIDE (GLUCOTROL) 5 MG tablet, Take 5 mg by mouth daily with lunch., Disp: , Rfl:    levothyroxine (SYNTHROID) 75 MCG tablet, Take 75 mcg by mouth daily., Disp: , Rfl:    losartan  (COZAAR ) 25 MG tablet, Take 1 tablet (25 mg total) by mouth daily., Disp: 90 tablet, Rfl: 3   metoprolol  succinate (TOPROL -XL) 25 MG 24 hr tablet, TAKE 1 TABLET(25 MG) BY MOUTH DAILY, Disp: 90 tablet, Rfl: 3   nitroGLYCERIN  (NITROSTAT ) 0.4 MG SL tablet, Place 1 tablet (0.4 mg total) under the tongue every 5 (five) minutes as needed for chest pain., Disp: 25 tablet, Rfl: 3   rosuvastatin  (CRESTOR ) 10 MG tablet, Take 1 tablet (10 mg total) by mouth daily., Disp: 90 tablet, Rfl: 3  Allergies:  Allergies  Allergen Reactions  Cat Dander Other (See Comments)    CATS- Sneezing/eye watering   Iohexol  Hives     Desc: HIVES W/ IV CONTRAST 20 YRS AGO/A.C.PRE-MED W/ PREDNISONE  AND BENADRYAL OK-05/14/05   Code: HIVES, Desc: HIVES FROM IV DYE IN 1988    Tetanus-Diphth-Acell Pertussis Swelling    Other reaction(s): redness/swelling at inj. site   Latex Itching and Rash   Tape Rash    Past Medical History, Surgical history, Social history, and Family History were reviewed and updated.  Review of Systems: Review of Systems  Constitutional:  Negative for appetite change, fatigue, fever and unexpected weight change.  HENT:   Negative for nosebleeds.   Respiratory:  Negative for cough, hemoptysis and shortness of breath.   Cardiovascular:  Negative for chest pain.   Gastrointestinal:  Negative for blood in stool.  Endocrine: Negative for hot flashes.  Genitourinary:  Negative for hematuria.   Hematological:  Does not bruise/bleed easily.   Physical Exam:  height is 4' 10 (1.473 m) and weight is 156 lb (70.8 kg). Victoria oral temperature is 97.4 F (36.3 C) (abnormal). Victoria blood pressure is 166/65 (abnormal) and Victoria pulse is 65. Victoria respiration is 16 and oxygen saturation is 97%.   Physical Exam Vitals and nursing note reviewed.  Constitutional:      General: She is not in acute distress.    Appearance: Normal appearance. She is not ill-appearing, toxic-appearing or diaphoretic.  HENT:     Head: Normocephalic and atraumatic.  Eyes:     General: No scleral icterus.    Conjunctiva/sclera: Conjunctivae normal.  Cardiovascular:     Rate and Rhythm: Normal rate and regular rhythm.     Heart sounds: Normal heart sounds.  Pulmonary:     Effort: Pulmonary effort is normal.     Breath sounds: Normal breath sounds.  Abdominal:     Palpations: Abdomen is soft.     Comments: No frank enlargement of spleen or liver  Musculoskeletal:     Cervical back: Neck supple.  Lymphadenopathy:     Cervical: No cervical adenopathy.  Skin:    Coloration: Skin is not jaundiced or pale.     Findings: No erythema or rash.  Neurological:     Mental Status: She is alert.     Lab Results  Component Value Date   WBC 4.9 06/02/2023   HGB 12.5 06/02/2023   HCT 38.2 06/02/2023   MCV 89.9 06/02/2023   PLT 94 (L) 06/02/2023     Chemistry      Component Value Date/Time   NA 142 06/02/2023 1122   NA 140 02/01/2023 0955   K 4.1 06/02/2023 1122   CL 106 06/02/2023 1122   CO2 28 06/02/2023 1122   BUN 15 06/02/2023 1122   BUN 16 02/01/2023 0955   CREATININE 1.01 (H) 06/02/2023 1122      Component Value Date/Time   CALCIUM  9.8 06/02/2023 1122   ALKPHOS 79 06/02/2023 1122   AST 32 06/02/2023 1122   ALT 15 06/02/2023 1122   BILITOT 0.9 06/02/2023 1122      Encounter Diagnosis  Name Primary?   Thrombocytopenia (HCC) Yes    Impression and Plan: Victoria Ferguson is a 83 year old female who presents with Thrombocytopenia. She is currently taking folic acid  and B12 supplementation. Blood smear previously reviewed by Dr. Timmy which showed normal appearing platelets- likely mild age related proliferative issue. Our plan is to continue to observe unless she starts to have additional cell line changes or  a significant drop in Victoria platelets over a short period of time.   Today WBC is normal, Hgb is normal and platelets are improved from 87 to 94.  Retic count is normal Glucose is down slightly- she is symptomatic- snack given in office. She will watch this closely at home.   RTC 6 months APP, labs (CBC w/, CMP, iron, ferritin, B12, folate)   Lauraine Dais PA-C 2/5/202512:05 PM

## 2023-06-03 ENCOUNTER — Encounter: Payer: Self-pay | Admitting: Medical Oncology

## 2023-07-08 ENCOUNTER — Other Ambulatory Visit: Payer: Self-pay | Admitting: *Deleted

## 2023-07-08 MED ORDER — FOLIC ACID 1 MG PO TABS: 1.0000 mg | ORAL_TABLET | Freq: Every day | ORAL | 3 refills | Status: DC

## 2023-07-12 NOTE — Progress Notes (Unsigned)
 Cardiology Office Note:    Date:  07/14/2023   ID:  KAMILLAH DIDONATO, DOB Oct 27, 1940, MRN [id number]  PCP:  Henrine Screws, MD   [facility] Providers Cardiologist:  Olga Millers, MD Cardiology APP:  Marcelino Duster, PA     Referring MD: Henrine Screws, MD   Chief Complaint  Patient presents with   Follow-up    HTN    History of Present Illness:    Victoria Ferguson is a 83 y.o. female with a hx of CAD, hypertension, hyperlipidemia.  He had a cardiac CTA December 2022 with elevated calcium score 2610 placing him in the 98th percentile with 25 to 49% left main, 50 to 69% LCx, occluded mid LAD with collaterals, 7090% RCA and PFO.  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  Interestingly FFR showed total occlusion but no other obstructive disease.  She was recommended for cardiac catheterization; however, platelet count had decreased to 50,000.  Hematology was consulted and platelets improved.  In discussion with Dr. Jens Som in March 2023, she preferred to wait until May for definitive angiography as she was moving.  I saw her in clinic 09/24/2021 to discuss definitive angiography.  Due to several stressful life events, she wanted to defer heart catheterization. She eventually had heart cath 12/19/21 which showed CTO of LAD with collaterals and moderate disease in the RCA. Medical therapy was recommended.   I saw her for post cath follow up 12/2021. We discussed low sodium diet (LVEDP elevated during cath) as she uses a lot of salt when cooking.   Given rise in sCr to 1.64, I stopped hydrochlorothiazide and reduced losartan to 25 mg. She complained of leg and arm pain and was still on 40 mg crestor despite rising creatinine. I recommended statin holiday and resumption of 10 mg crestor. Recheck of BMP showed sCr 1.07.  When I saw her 01/2023 she reported feeling less tired with improved leg pain with a statin holiday.  I started 2.5 mg amlodipine for her elevated blood pressure.  She  states she takes her BP in the morning before medications and is concerned about taking BP meds. She can get dizzy at times. She also has some mild LE swelling and legs ache in the morning. No chest pain. She still takes care of the trashcans and her husband.   Past Medical History:  Diagnosis Date   Diabetes mellitus    Hyperlipemia    Hypertension    Hypothyroidism    Lung nodule     Past Surgical History:  Procedure Laterality Date   ABDOMINAL HYSTERECTOMY  04/27/1986   CARDIAC CATHETERIZATION     CATARACT EXTRACTION Bilateral    CHOLECYSTECTOMY  04/27/1988   LEFT HEART CATH AND CORONARY ANGIOGRAPHY N/A 12/19/2021   Procedure: LEFT HEART CATH AND CORONARY ANGIOGRAPHY;  Surgeon: Marykay Lex, MD;  Location: [facility] INVASIVE CV LAB;  Service: Cardiovascular;  Laterality: N/A;   TUBAL LIGATION  04/27/1972    Current Medications: Current Meds  Medication Sig   amLODipine (NORVASC) 2.5 MG tablet Take 1 tablet (2.5 mg total) by mouth daily.   aspirin EC 81 MG tablet Take 1 tablet (81 mg total) by mouth daily. Swallow whole.   cyanocobalamin (VITAMIN B12) 1000 MCG tablet Take 1,000 mcg by mouth daily.   DERMOTIC 0.01 % OIL Place 20 mLs in ear(s) as needed.   ergocalciferol (VITAMIN D2) 1.25 MG (50000 UT) capsule Take 50,000 Units by mouth every Monday. WITH LUNCH   ezetimibe (ZETIA) 10  MG tablet Take 1 tablet (10 mg total) by mouth daily.   ferrous sulfate 325 (65 FE) MG tablet Take 325 mg by mouth every 3 (three) days.   folic acid (FOLVITE) 1 MG tablet Take 1 tablet (1 mg total) by mouth daily.   FREESTYLE LITE test strip    glipiZIDE (GLUCOTROL) 5 MG tablet Take 5 mg by mouth daily with lunch.   levothyroxine (SYNTHROID) 75 MCG tablet Take 75 mcg by mouth daily.   losartan (COZAAR) 25 MG tablet Take 1 tablet (25 mg total) by mouth daily.   metoprolol succinate (TOPROL-XL) 25 MG 24 hr tablet TAKE 1 TABLET(25 MG) BY MOUTH DAILY   nitroGLYCERIN (NITROSTAT) 0.4 MG SL tablet Place 1  tablet (0.4 mg total) under the tongue every 5 (five) minutes as needed for chest pain.   rosuvastatin (CRESTOR) 10 MG tablet Take 1 tablet (10 mg total) by mouth daily.     Allergies:   Cat dander, Iohexol, Tetanus-diphth-acell pertussis, Latex, and Tape   Social History   Socioeconomic History   Marital status: Married    Spouse name: Not on file   Number of children: 4   Years of education: 13   Highest education level: Some college, no degree  Occupational History   Occupation: Retired  Tobacco Use   Smoking status: Former    Types: Cigarettes   Smokeless tobacco: Not on Ship broker   Vaping status: Never Used  Substance and Sexual Activity   Alcohol use: Never   Drug use: No   Sexual activity: Not Currently  Other Topics Concern   Not on file  Social History Narrative   Not on file   Social Drivers of Health   Financial Resource Strain: Not on file  Food Insecurity: No Food Insecurity (06/02/2022)   Hunger Vital Sign    Worried About Running Out of Food in the Last Year: Never true    Ran Out of Food in the Last Year: Never true  Transportation Needs: No Transportation Needs (06/02/2022)   PRAPARE - Administrator, Civil Service (Medical): No    Lack of Transportation (Non-Medical): No  Physical Activity: Unknown (06/02/2022)   Exercise Vital Sign    Days of Exercise per Week: 2 days    Minutes of Exercise per Session: Not on file  Stress: Not on file  Social Connections: Not on file     Family History: The patient's family history includes CAD in her father; Heart failure in her father and mother.  ROS:   Please see the history of present illness.     All other systems reviewed and are negative.  EKGs/Labs/Other Studies Reviewed:    The following studies were reviewed today: Cardiac Studies & Procedures   ______________________________________________________________________________________________ CARDIAC CATHETERIZATION  CARDIAC  CATHETERIZATION 12/19/2021  Narrative   Prox LAD to Mid LAD lesion is 100% stenosed.   Dist Cx lesion is 40% stenosed.  1st Mrg lesion is 45% stenosed.   Prox RCA-1 lesion is 40% stenosed.   Prox RCA-2 lesion is 60% stenosed. ->  Most likely potential PCI target if indicated.   Prox RCA to Mid RCA lesion is 50% stenosed.   There is hyperdynamic left ventricular systolic function.  The left ventricular ejection fraction is greater than 65% by visual estimate.   LV end diastolic pressure is mildly elevated.   There is no aortic valve stenosis.  POST CATH DIAGNOSES Severe CAD: 100% flush CTO of the LAD after  septal perforator.  After 20+ mm segment of occlusion, vessel fills via L-L collaterals from distal LCx-OM, and R-L collaterals from distal PDA-distal LAD and septal perforators to the LAD perforators. RCA has diffuse 40-60 and 50% stenoses in the proximal to mid segment.  (Per CTA, distal vessel FFR was borderline at 0.8, but mid vessel was only 0.5). Large-caliber codominant LCx with very large OM 1 that bifurcates (30% stenosis with OM 2-LPL 1 with ostial 40%. Hyperdynamic LV function EF > 65%, and minimally elevated LVEDP.   RECOMMENDATIONS Discharge home with plans for medical management.  Consider titrating up beta-blocker dose and potentially adding long-acting nitrate, amlodipine or Ranexa. If unable to control symptoms with medications, could consider segmental PCI of the RCA as this was the only vessel that was borderline FFR positive.  The diffuse moderate disease would likely require an extensive amount of stenting. LAD is flush occluded did not likely to be a candidate for CTO PCI.Marland Kitchen    Bryan Lemma, MD  Findings Coronary Findings Diagnostic  Dominance: Co-dominant  Left Anterior Descending There is mild diffuse disease throughout the vessel. The vessel is calcified. Collaterals Dist LAD filled by collaterals from RPDA.  Collaterals Dist LAD filled by collaterals  from Lat 1st Mrg.  Prox LAD to Mid LAD lesion is 100% stenosed.  First Septal Branch Vessel is small in size. Major branching septal perforator branch that provides most of the proximal septal perforation  Second Septal Branch Vessel is small in size. Collaterals 2nd Sept filled by collaterals from Inf Sept.  Collaterals 2nd Sept filled by collaterals from 1st Sept.  Third Septal Branch Vessel is small in size. Collaterals 3rd Sept filled by collaterals from Inf Sept.  Collaterals 3rd Sept filled by collaterals from 1st Sept.  Left Circumflex Vessel is large. Dist Cx lesion is 40% stenosed.  First Obtuse Marginal Branch Vessel is moderate in size. 1st Mrg lesion is 45% stenosed.  First Left Posterolateral Branch Vessel is large in size.  Second Left Posterolateral Branch Vessel is moderate in size.  Left Posterior Atrioventricular Artery Vessel is large in size.  Right Coronary Artery Vessel was injected. Vessel is normal in caliber. There is mild diffuse disease throughout the vessel. There is moderate focal disease in the vessel. Prox RCA-1 lesion is 40% stenosed. Prox RCA-2 lesion is 60% stenosed. Prox RCA to Mid RCA lesion is 50% stenosed. The lesion is type A, focal and eccentric.  Acute Marginal Branch Vessel is small in size.  Right Ventricular Branch Vessel is small in size.  Intervention  No interventions have been documented.   STRESS TESTS  MYOCARDIAL PERFUSION IMAGING 02/15/2015  Narrative  The left ventricular ejection fraction is hyperdynamic (>65%).  Nuclear stress EF: 82%.  There was no ST segment deviation noted during stress.  No T wave inversion was noted during stress.  Defect 1: There is a small defect of mild severity.  Small, mild fixed apical attenuation artifact. No significant reversible ischemia. LVEF 82% with normal wall motion. This is a low risk study.   ECHOCARDIOGRAM  ECHOCARDIOGRAM COMPLETE  04/07/2021  Narrative ECHOCARDIOGRAM REPORT    Patient Name:   SURENA WELGE Griffey Date of Exam: 04/07/2021 Medical Rec #:  [id number]        Height:       60.0 in Accession #:    [id number]       Weight:       159.2 lb Date of Birth:  12/30/1940  BSA:          1.694 m Patient Age:    80 years         BP:           157/84 mmHg Patient Gender: F                HR:           98 bpm. Exam Location:  [address]  Procedure: 2D Echo  Indications:    R06.00 SOB  History:        Patient has no prior history of Echocardiogram examinations. Risk Factors:Hypertension, Diabetes and HLD.  Sonographer:    Clearence Ped RCS Referring Phys: 79 BRIAN S CRENSHAW  IMPRESSIONS   1. Left ventricular ejection fraction, by estimation, is 65 to 70%. The left ventricle has normal function. The left ventricle has no regional wall motion abnormalities. There is moderate asymmetric left ventricular hypertrophy of the basal-septal segment. Left ventricular diastolic parameters are consistent with Grade I diastolic dysfunction (impaired relaxation). 2. Right ventricular systolic function is normal. The right ventricular size is normal. 3. The mitral valve is normal in structure. Trivial mitral valve regurgitation. 4. The aortic valve is tricuspid. There is mild calcification of the aortic valve. There is mild thickening of the aortic valve. Aortic valve regurgitation is not visualized. Aortic valve sclerosis/calcification is present, without any evidence of aortic stenosis. 5. The inferior vena cava is normal in size with greater than 50% respiratory variability, suggesting right atrial pressure of 3 mmHg.  Comparison(s): No prior Echocardiogram.  FINDINGS Left Ventricle: Left ventricular ejection fraction, by estimation, is 65 to 70%. The left ventricle has normal function. The left ventricle has no regional wall motion abnormalities. Global longitudinal strain performed but not reported based  on interpreter judgement due to suboptimal tracking. The left ventricular internal cavity size was normal in size. There is moderate asymmetric left ventricular hypertrophy of the basal-septal segment. Left ventricular diastolic parameters are consistent with Grade I diastolic dysfunction (impaired relaxation).  Right Ventricle: The right ventricular size is normal. No increase in right ventricular wall thickness. Right ventricular systolic function is normal.  Left Atrium: Left atrial size was normal in size.  Right Atrium: Right atrial size was normal in size.  Pericardium: There is no evidence of pericardial effusion.  Mitral Valve: The mitral valve is normal in structure. There is mild thickening of the mitral valve leaflet(s). There is mild calcification of the mitral valve leaflet(s). Mild mitral annular calcification. Trivial mitral valve regurgitation.  Tricuspid Valve: The tricuspid valve is normal in structure. Tricuspid valve regurgitation is not demonstrated.  Aortic Valve: The aortic valve is tricuspid. There is mild calcification of the aortic valve. There is mild thickening of the aortic valve. Aortic valve regurgitation is not visualized. Aortic valve sclerosis/calcification is present, without any evidence of aortic stenosis. Aortic valve mean gradient measures 7.0 mmHg. Aortic valve peak gradient measures 11.7 mmHg. Aortic valve area, by VTI measures 1.74 cm.  Pulmonic Valve: The pulmonic valve was normal in structure. Pulmonic valve regurgitation is trivial.  Aorta: The aortic root and ascending aorta are structurally normal, with no evidence of dilitation.  Venous: The inferior vena cava is normal in size with greater than 50% respiratory variability, suggesting right atrial pressure of 3 mmHg.  IAS/Shunts: No atrial level shunt detected by color flow Doppler.   LEFT VENTRICLE PLAX 2D LVIDd:         2.40 cm   Diastology LVIDs:  1.70 cm   LV e' medial:     7.51 cm/s LV PW:         0.90 cm   LV E/e' medial:  9.3 LV IVS:        1.30 cm   LV e' lateral:   6.85 cm/s LVOT diam:     1.80 cm   LV E/e' lateral: 10.2 LV SV:         52 LV SV Index:   31 LVOT Area:     2.54 cm   RIGHT VENTRICLE RV Basal diam:  2.80 cm RV S prime:     10.90 cm/s TAPSE (M-mode): 1.4 cm  LEFT ATRIUM             Index        RIGHT ATRIUM          Index LA diam:        2.70 cm 1.59 cm/m   RA Area:     5.92 cm LA Vol (A2C):   24.1 ml 14.23 ml/m  RA Volume:   8.29 ml  4.89 ml/m LA Vol (A4C):   6.5 ml  3.83 ml/m LA Biplane Vol: 13.0 ml 7.67 ml/m AORTIC VALVE AV Area (Vmax):    1.61 cm AV Area (Vmean):   1.53 cm AV Area (VTI):     1.74 cm AV Vmax:           171.00 cm/s AV Vmean:          122.000 cm/s AV VTI:            0.299 m AV Peak Grad:      11.7 mmHg AV Mean Grad:      7.0 mmHg LVOT Vmax:         108.00 cm/s LVOT Vmean:        73.500 cm/s LVOT VTI:          0.204 m LVOT/AV VTI ratio: 0.68  AORTA Ao Root diam: 3.10 cm Ao Asc diam:  3.10 cm  MITRAL VALVE MV Area (PHT):              SHUNTS MV Decel Time:              Systemic VTI:  0.20 m MV E velocity: 69.70 cm/s   Systemic Diam: 1.80 cm MV A velocity: 108.00 cm/s MV E/A ratio:  0.65  Laurance Flatten MD Electronically signed by Laurance Flatten MD Signature Date/Time: 04/07/2021/4:35:37 PM    Final      CT SCANS  CT CORONARY FRACTIONAL FLOW RESERVE DATA PREP 03/27/2021  Narrative CLINICAL DATA:  CAD  EXAM: FFR CT  TECHNIQUE: The best systolic and diastolic phases of the patients gated cardiac CTA sent to [facility] for hemodynamic analysis  FINDINGS: LAD: Modeled as a total occlusion  RCA: 0.85 in mid section and 0.80 distally  LCX: 0.96 OM1: 0.82, OM2 0.88  IMPRESSION: Total occlusion of mid LAD  No other hemodynamically significant stenosis modeled  Charlton Haws   Electronically Signed By: Charlton Haws M.D. On: 03/28/2021 07:52   CT CORONARY MORPH  W/CTA COR W/SCORE 03/27/2021  Addendum 03/27/2021  4:38 PM ADDENDUM REPORT: 03/27/2021 16:35  CLINICAL DATA:  Chest pain  EXAM: Cardiac CTA  MEDICATIONS: Sub lingual nitro. 4 mg and lopressor 100mg   TECHNIQUE: The patient was scanned on a [employer] 192 scanner. Gantry rotation speed was 250 msecs. Collimation was. 6 mm . A 120 kV prospective scan was triggered in the  ascending thoracic aorta at 140 HU's with full mA between 30-70% of the R-R interval . Average HR during the scan was 59 bpm. The 3D data set was interpreted on a dedicated work station using MPR, MIP and VRT modes. A total of 80 cc of contrast was used.  FINDINGS: Non-cardiac: See separate report from [facility]. No significant findings on limited lung and soft tissue windows.  Calcium score: Severe 3 vessel coronary calcium  Coronary Arteries: Right dominant with no anomalies  LM: Short segment 25-49% ostial calcium  LAD: Heavily calcified to ostium Occluded mid vessel with likely retrograde filling of the distal LAD  IM: Small diffusely diseased and calcified  Diagonal branches not well seen  Circumflex: Long area of 50-69% calcified plaque proximally 25-49% calcified plaque in mid/distal vessel and AV groove  OM1: 25-49% calcified plaque proximally  RCA: Heavily calcified 50-69% calcified plaque proximally 70-99% calcified plaque in mid vessel 25-49% calcified plaque distally at crux of PDA take off  IMPRESSION: 1. 3 Vessel coronary calcium score 2610 which is 98 th percentile for age / sex  2. CAD RADS 5 Occluded mid LAD with distal vessel filling by collaterals Likely high grade mid RCA disease as well  3. Study sent for FFR if they can model but would proceed with heart catheterization  4.  Normal ascending thoracic aorta 2.8 cm  5.  PFO present  Charlton Haws   Electronically Signed By: Charlton Haws M.D. On: 03/27/2021 16:35  Addendum 03/27/2021  4:10 PM ADDENDUM  REPORT: 03/27/2021 16:08  ADDENDUM: Upon further review in the central aspect of the right lower lobe (axial image 30 of series 12) there is a 13 x 9 mm (mean diameter of 10.5 mm) smoothly marginated pulmonary nodule which appears to have some internal macroscopic lipid. This is very similar to remote prior study from 08/05/2007 which measured 1.0 x 0.8 cm on axial image 35 of series 3 when measured in a similar fashion on the prior examination, presumably a benign lesion such as a hamartoma.   Electronically Signed By: Trudie Reed M.D. On: 03/27/2021 16:08  Narrative EXAM: OVER-READ INTERPRETATION  CT CHEST  The following report is an over-read performed by radiologist Dr. Trudie Reed of [facility] on 03/27/2021. This over-read does not include interpretation of cardiac or coronary anatomy or pathology. The coronary calcium score/coronary CTA interpretation by the cardiologist is attached.  COMPARISON:  None.  FINDINGS: Atherosclerotic calcifications in the thoracic aorta. Within the visualized portions of the thorax there are no suspicious appearing pulmonary nodules or masses, there is no acute consolidative airspace disease, no pleural effusions, no pneumothorax and no lymphadenopathy. Visualized portions of the upper abdomen are unremarkable. There are no aggressive appearing lytic or blastic lesions noted in the visualized portions of the skeleton.  IMPRESSION: 1.  Aortic Atherosclerosis (ICD10-I70.0).  Electronically Signed: By: Trudie Reed M.D. On: 03/27/2021 15:26     ______________________________________________________________________________________________           Recent Labs: 06/02/2023: ALT 15; BUN 15; Creatinine 1.01; Hemoglobin 12.5; Platelet Count 94; Potassium 4.1; Sodium 142  Recent Lipid Panel    Component Value Date/Time   CHOL 116 09/24/2021 1605   TRIG 156 (H) 09/24/2021 1605   HDL 49 09/24/2021 1605    CHOLHDL 2.4 09/24/2021 1605   LDLCALC 41 09/24/2021 1605   LDLDIRECT 39 09/24/2021 1605     Risk Assessment/Calculations:  Physical Exam:    VS:  BP 134/64 (BP Location: Left Arm, Patient Position: Sitting, Cuff Size: Normal)   Pulse 63   Ht 4\' 10"  (1.473 m)   Wt 152 lb (68.9 kg)   BMI 31.77 kg/m     Wt Readings from Last 3 Encounters:  07/14/23 152 lb (68.9 kg)  06/02/23 156 lb (70.8 kg)  03/02/23 150 lb 0.6 oz (68.1 kg)     GEN:  Well nourished, well developed in no acute distress HEENT: Normal NECK: No JVD; No carotid bruits LYMPHATICS: No lymphadenopathy CARDIAC: RRR, no murmurs, rubs, gallops RESPIRATORY:  Clear to auscultation without rales, wheezing or rhonchi  ABDOMEN: Soft, non-tender, non-distended MUSCULOSKELETAL:  minimal LE edema; No deformity  SKIN: Warm and dry NEUROLOGIC:  Alert and oriented x 3 PSYCHIATRIC:  Normal affect   ASSESSMENT:    1. Coronary artery disease involving native coronary artery of native heart without angina pectoris   2. Hyperlipidemia with target LDL less than 70   3. Primary hypertension   4. Stage 3a chronic kidney disease (HCC)    PLAN:    In order of problems listed above:  CAD CTA coronary concerning for LAD disease GDMT includes aspirin, Toprol, statin Left heart catheterization with CTO of LAD with collaterals, no PCI. Medical therapy recommended with titration of long acting nitrate, amlodipine, or ranexa.  -2.5 mg amlodipine - will stop this and see if dizziness resolves and if LE swelling resolves   Thrombocytopenia - on folate, B12   Hypertension PTA Toprol 25 mg, 25 mg losartan - BP well controlled, will stop 2.5 mg amlodipine and see how she does   Hyperlipidemia with LDL goal < 70 She is on 10 mg Zetia 09/24/2021: Cholesterol, Total 116; HDL 49; LDL Chol Calc (NIH) 41; Triglycerides 156 She discontinue 20 mg Crestor for fatigue and leg pain   CKD 3a sCr 1.64, reduced crestor 40  mg to 10 mg, stopped hydrochlorothiazide, reduced losartan to 25 mg - recheck BMP showed improved sCr to 1.07   Follow up in 6 months.           Medication Adjustments/Labs and Tests Ordered: Current medicines are reviewed at length with the patient today.  Concerns regarding medicines are outlined above.  No orders of the defined types were placed in this encounter.  No orders of the defined types were placed in this encounter.   There are no Patient Instructions on file for this visit.   Signed, Marcelino Duster, PA  07/14/2023 2:02 PM    [facility]

## 2023-07-14 ENCOUNTER — Encounter: Payer: Self-pay | Admitting: Physician Assistant

## 2023-07-14 ENCOUNTER — Ambulatory Visit: Payer: Medicare Other | Attending: Physician Assistant | Admitting: Physician Assistant

## 2023-07-14 VITALS — BP 134/64 | HR 63 | Ht <= 58 in | Wt 152.0 lb

## 2023-07-14 DIAGNOSIS — I2510 Atherosclerotic heart disease of native coronary artery without angina pectoris: Secondary | ICD-10-CM | POA: Diagnosis not present

## 2023-07-14 DIAGNOSIS — N1831 Chronic kidney disease, stage 3a: Secondary | ICD-10-CM

## 2023-07-14 DIAGNOSIS — E785 Hyperlipidemia, unspecified: Secondary | ICD-10-CM | POA: Diagnosis not present

## 2023-07-14 DIAGNOSIS — I10 Essential (primary) hypertension: Secondary | ICD-10-CM | POA: Diagnosis present

## 2023-07-14 NOTE — Patient Instructions (Addendum)
 Medication Instructions:  STOP AMLODIPINE *If you need a refill on your cardiac medications before your next appointment, please call your pharmacy*   Lab Work: NO LABS If you have labs (blood work) drawn today and your tests are completely normal, you will receive your results only by: MyChart Message (if you have MyChart) OR A paper copy in the mail If you have any lab test that is abnormal or we need to change your treatment, we will call you to review the results.   Testing/Procedures: NO TESTING   Follow-Up: At [facility], you and your health needs are our priority.  As part of our continuing mission to provide you with exceptional heart care, we have created designated Provider Care Teams.  These Care Teams include your primary Cardiologist (physician) and Advanced Practice Providers (APPs -  Physician Assistants and Nurse Practitioners) who all work together to provide you with the care you need, when you need it.  Your next appointment:   6 month(s)  Provider:   Olga Millers, MD   Other Instructions CONTINUE TO MONITOR BLOOD PRESSURE AND KEEP RECORD.

## 2023-07-27 ENCOUNTER — Other Ambulatory Visit: Payer: Self-pay

## 2023-07-27 MED ORDER — LOSARTAN POTASSIUM 25 MG PO TABS: 25.0000 mg | ORAL_TABLET | Freq: Every day | ORAL | 3 refills | Status: AC

## 2023-08-09 DIAGNOSIS — E039 Hypothyroidism, unspecified: Secondary | ICD-10-CM | POA: Diagnosis not present

## 2023-08-09 DIAGNOSIS — H9193 Unspecified hearing loss, bilateral: Secondary | ICD-10-CM | POA: Diagnosis not present

## 2023-08-09 DIAGNOSIS — I10 Essential (primary) hypertension: Secondary | ICD-10-CM | POA: Diagnosis not present

## 2023-08-09 DIAGNOSIS — Z0000 Encounter for general adult medical examination without abnormal findings: Secondary | ICD-10-CM | POA: Diagnosis not present

## 2023-08-09 DIAGNOSIS — E559 Vitamin D deficiency, unspecified: Secondary | ICD-10-CM | POA: Diagnosis not present

## 2023-08-09 DIAGNOSIS — D473 Essential (hemorrhagic) thrombocythemia: Secondary | ICD-10-CM | POA: Diagnosis not present

## 2023-08-09 DIAGNOSIS — R54 Age-related physical debility: Secondary | ICD-10-CM | POA: Diagnosis not present

## 2023-08-09 DIAGNOSIS — E119 Type 2 diabetes mellitus without complications: Secondary | ICD-10-CM | POA: Diagnosis not present

## 2023-11-23 ENCOUNTER — Other Ambulatory Visit: Payer: Self-pay

## 2023-11-23 MED ORDER — ROSUVASTATIN CALCIUM 10 MG PO TABS: 10.0000 mg | ORAL_TABLET | Freq: Every day | ORAL | 2 refills | Status: AC

## 2023-11-30 ENCOUNTER — Encounter: Payer: Self-pay | Admitting: Medical Oncology

## 2023-11-30 ENCOUNTER — Inpatient Hospital Stay (HOSPITAL_BASED_OUTPATIENT_CLINIC_OR_DEPARTMENT_OTHER): Payer: Medicare Other | Admitting: Medical Oncology

## 2023-11-30 ENCOUNTER — Inpatient Hospital Stay: Payer: Medicare Other | Attending: Medical Oncology

## 2023-11-30 VITALS — BP 149/60 | HR 58 | Temp 97.9°F | Resp 19 | Ht <= 58 in | Wt 158.0 lb

## 2023-11-30 DIAGNOSIS — D696 Thrombocytopenia, unspecified: Secondary | ICD-10-CM

## 2023-11-30 DIAGNOSIS — Z887 Allergy status to serum and vaccine status: Secondary | ICD-10-CM | POA: Insufficient documentation

## 2023-11-30 DIAGNOSIS — E785 Hyperlipidemia, unspecified: Secondary | ICD-10-CM | POA: Insufficient documentation

## 2023-11-30 DIAGNOSIS — Z7982 Long term (current) use of aspirin: Secondary | ICD-10-CM | POA: Insufficient documentation

## 2023-11-30 DIAGNOSIS — Z888 Allergy status to other drugs, medicaments and biological substances status: Secondary | ICD-10-CM | POA: Diagnosis not present

## 2023-11-30 DIAGNOSIS — Z79899 Other long term (current) drug therapy: Secondary | ICD-10-CM | POA: Insufficient documentation

## 2023-11-30 DIAGNOSIS — N1831 Chronic kidney disease, stage 3a: Secondary | ICD-10-CM | POA: Diagnosis not present

## 2023-11-30 DIAGNOSIS — I129 Hypertensive chronic kidney disease with stage 1 through stage 4 chronic kidney disease, or unspecified chronic kidney disease: Secondary | ICD-10-CM | POA: Diagnosis not present

## 2023-11-30 DIAGNOSIS — Z79890 Hormone replacement therapy (postmenopausal): Secondary | ICD-10-CM | POA: Insufficient documentation

## 2023-11-30 DIAGNOSIS — E1122 Type 2 diabetes mellitus with diabetic chronic kidney disease: Secondary | ICD-10-CM | POA: Diagnosis not present

## 2023-11-30 LAB — FOLATE: Folate: >40 ng/mL (ref >5.9)

## 2023-11-30 LAB — CMP (CANCER CENTER ONLY)
ALT: 18 U/L (ref 0–44)
AST: 36 U/L (ref 15–41)
Albumin: 3.9 g/dL (ref 3.5–5.0)
Alkaline Phosphatase: 79 U/L (ref 38–126)
Anion gap: 10 (ref 5–15)
BUN: 16 mg/dL (ref 8–23)
CO2: 25 mmol/L (ref 22–32)
Calcium: 9.5 mg/dL (ref 8.9–10.3)
Chloride: 105 mmol/L (ref 98–111)
Creatinine: 0.98 mg/dL (ref 0.44–1.00)
GFR, Estimated: 57 mL/min — ABNORMAL LOW (ref >60)
Glucose, Bld: 128 mg/dL — ABNORMAL HIGH (ref 70–99)
Potassium: 4.1 mmol/L (ref 3.5–5.1)
Sodium: 140 mmol/L (ref 135–145)
Total Bilirubin: 0.9 mg/dL (ref 0.0–1.2)
Total Protein: 6.8 g/dL (ref 6.5–8.1)

## 2023-11-30 LAB — FERRITIN: Ferritin: 36 ng/mL (ref 11–307)

## 2023-11-30 LAB — RETIC PANEL
Immature Retic Fract: 13 % (ref 2.3–15.9)
RBC.: 4.3 MIL/uL (ref 3.87–5.11)
Retic Count, Absolute: 78.3 K/uL (ref 19.0–186.0)
Retic Ct Pct: 1.8 % (ref 0.4–3.1)
Reticulocyte Hemoglobin: 33 pg (ref >27.9)

## 2023-11-30 LAB — CBC
HCT: 38.1 % (ref 36.0–46.0)
Hemoglobin: 12.7 g/dL (ref 12.0–15.0)
MCH: 29.5 pg (ref 26.0–34.0)
MCHC: 33.3 g/dL (ref 30.0–36.0)
MCV: 88.4 fL (ref 80.0–100.0)
Platelets: 84 K/uL — ABNORMAL LOW (ref 150–400)
RBC: 4.31 MIL/uL (ref 3.87–5.11)
RDW: 14.7 % (ref 11.5–15.5)
WBC: 4.7 K/uL (ref 4.0–10.5)
nRBC: 0 % (ref 0.0–0.2)

## 2023-11-30 LAB — VITAMIN B12: Vitamin B-12: 800 pg/mL (ref 180–914)

## 2023-11-30 LAB — IRON AND IRON BINDING CAPACITY (CC-WL,HP ONLY)
Iron: 75 ug/dL (ref 28–170)
Saturation Ratios: 20 % (ref 10.4–31.8)
TIBC: 381 ug/dL (ref 250–450)
UIBC: 306 ug/dL

## 2023-11-30 LAB — SEDIMENTATION RATE: Sed Rate: 24 mm/h — ABNORMAL HIGH (ref 0–22)

## 2023-11-30 LAB — SAVE SMEAR(SSMR), FOR PROVIDER SLIDE REVIEW

## 2023-11-30 NOTE — Progress Notes (Signed)
 Hematology and Oncology Follow Up Visit  Victoria Ferguson [id number] December 13, 1940 83 y.o. 11/30/2023  Past Medical History:  Diagnosis Date   Diabetes mellitus    Hyperlipemia    Hypertension    Hypothyroidism    Lung nodule     Principle Diagnosis:  Thrombocytopenia  Current Therapy:   B12 1,000 mg daily Folate 1 mg BID  PriorTherapy:   None    Interim History:  Victoria Ferguson is back for follow-up for her thrombocytopenia.   She states that she has been fair.  She is still taking care of her husband who is bed bound which has been hard on her.   There has been no bleeding to her knowledge: denies epistaxis, gingivitis, hemoptysis, hematemesis, hematuria, melena, excessive bruising, blood donation.   She continues to be followed closely by her many specialists. She sees both cardiology and PCP. PCP has made some BP medication adjustments recently. She is not currently seeing a nephrologist- has seen them in the past. She does have CKD with preserved EPO levels.   She has history of CAD- too occluded to stent- bypass grafting deferred due to her caretaker status.   In terms of her thrombocytopenia she has not noticed any recent infections nor has she had any bleeding episodes or bruising events. She is not on blood thinners. No upcoming procedures.      Wt Readings from Last 3 Encounters:  11/30/23 158 lb (71.7 kg)  07/14/23 152 lb (68.9 kg)  06/02/23 156 lb (70.8 kg)     Medications:   Current Outpatient Medications:    aspirin  EC 81 MG tablet, Take 1 tablet (81 mg total) by mouth daily. Swallow whole., Disp: 90 tablet, Rfl: 3   Cholecalciferol 125 MCG (5000 UT) capsule, Take 5,000 Units by mouth daily., Disp: , Rfl:    cyanocobalamin  (VITAMIN B12) 1000 MCG tablet, Take 1,000 mcg by mouth daily., Disp: , Rfl:    DERMOTIC 0.01 % OIL, Place 20 mLs in ear(s) as needed., Disp: , Rfl:    ezetimibe  (ZETIA ) 10 MG tablet, Take 1 tablet (10 mg total) by mouth daily., Disp: 90  tablet, Rfl: 3   ferrous sulfate 325 (65 FE) MG tablet, Take 325 mg by mouth every 3 (three) days., Disp: , Rfl:    folic acid  (FOLVITE ) 1 MG tablet, Take 1 tablet (1 mg total) by mouth daily., Disp: 30 tablet, Rfl: 3   FREESTYLE LITE test strip, , Disp: , Rfl:    glipiZIDE (GLUCOTROL) 5 MG tablet, Take 5 mg by mouth daily with lunch., Disp: , Rfl:    levothyroxine (SYNTHROID) 75 MCG tablet, Take 75 mcg by mouth daily., Disp: , Rfl:    losartan  (COZAAR ) 25 MG tablet, Take 1 tablet (25 mg total) by mouth daily., Disp: 90 tablet, Rfl: 3   metoprolol  succinate (TOPROL -XL) 25 MG 24 hr tablet, TAKE 1 TABLET(25 MG) BY MOUTH DAILY, Disp: 90 tablet, Rfl: 3   nitroGLYCERIN  (NITROSTAT ) 0.4 MG SL tablet, Place 1 tablet (0.4 mg total) under the tongue every 5 (five) minutes as needed for chest pain., Disp: 25 tablet, Rfl: 3   rosuvastatin  (CRESTOR ) 10 MG tablet, Take 1 tablet (10 mg total) by mouth daily., Disp: 90 tablet, Rfl: 2  Allergies:  Allergies  Allergen Reactions   Cat Dander Other (See Comments)    CATS- Sneezing/eye watering   Iohexol  Hives     Desc: HIVES W/ IV CONTRAST 20 YRS AGO/A.C.PRE-MED W/ PREDNISONE  AND BENADRYAL OK-05/14/05   Code:  HIVES, Desc: HIVES FROM IV DYE IN 1988    Tetanus-Diphth-Acell Pertussis Swelling    Other reaction(s): redness/swelling at inj. site   Latex Itching and Rash   Tape Rash    Past Medical History, Surgical history, Social history, and Family History were reviewed and updated.  Review of Systems: Review of Systems  Constitutional:  Negative for appetite change, fatigue, fever and unexpected weight change.  HENT:   Negative for nosebleeds.   Respiratory:  Negative for cough, hemoptysis and shortness of breath.   Cardiovascular:  Negative for chest pain.  Gastrointestinal:  Negative for blood in stool.  Endocrine: Negative for hot flashes.  Genitourinary:  Negative for hematuria.   Hematological:  Does not bruise/bleed easily.   Physical Exam:   height is 4' 10 (1.473 m) and weight is 158 lb (71.7 kg). Her oral temperature is 97.9 F (36.6 C). Her blood pressure is 149/60 (abnormal) and her pulse is 58 (abnormal). Her respiration is 19 and oxygen saturation is 98%.   Physical Exam Vitals and nursing note reviewed.  Constitutional:      General: She is not in acute distress.    Appearance: Normal appearance. She is not ill-appearing, toxic-appearing or diaphoretic.  HENT:     Head: Normocephalic and atraumatic.  Eyes:     General: No scleral icterus.    Conjunctiva/sclera: Conjunctivae normal.  Cardiovascular:     Rate and Rhythm: Normal rate and regular rhythm.     Heart sounds: Normal heart sounds.  Pulmonary:     Effort: Pulmonary effort is normal.     Breath sounds: Normal breath sounds.  Abdominal:     Palpations: Abdomen is soft.     Comments: No frank enlargement of spleen or liver  Musculoskeletal:     Cervical back: Neck supple.  Lymphadenopathy:     Cervical: No cervical adenopathy.  Skin:    Coloration: Skin is not jaundiced or pale.     Findings: No erythema or rash.  Neurological:     Mental Status: She is alert.     Lab Results  Component Value Date   WBC 4.7 11/30/2023   HGB 12.7 11/30/2023   HCT 38.1 11/30/2023   MCV 88.4 11/30/2023   PLT 84 (L) 11/30/2023     Chemistry      Component Value Date/Time   NA 142 06/02/2023 1122   NA 140 02/01/2023 0955   K 4.1 06/02/2023 1122   CL 106 06/02/2023 1122   CO2 28 06/02/2023 1122   BUN 15 06/02/2023 1122   BUN 16 02/01/2023 0955   CREATININE 1.01 (H) 06/02/2023 1122      Component Value Date/Time   CALCIUM  9.8 06/02/2023 1122   ALKPHOS 79 06/02/2023 1122   AST 32 06/02/2023 1122   ALT 15 06/02/2023 1122   BILITOT 0.9 06/02/2023 1122     Encounter Diagnosis  Name Primary?   Thrombocytopenia (HCC) Yes   Impression and Plan: Victoria Ferguson is a 83 year old female who presents with Thrombocytopenia. She is currently taking folic acid  and B12  supplementation. Blood smear previously reviewed by Dr. Timmy which showed normal appearing platelets- likely mild age related proliferative issue. Our plan is to continue to observe unless she starts to have additional cell line changes or a significant drop in her platelets over a short period of time.   Today WBC is 4.7 Hgb is 12.7 and platelets are 84 which is fairly stable for her Retic count is normal  RTC 6 months APP, labs (CBC w/, CMP, iron, ferritin, B12, folate)   Lauraine Dais PA-C 8/5/20252:58 PM

## 2023-12-01 ENCOUNTER — Ambulatory Visit: Payer: Self-pay | Admitting: Medical Oncology

## 2023-12-13 ENCOUNTER — Other Ambulatory Visit: Payer: Self-pay | Admitting: Physician Assistant

## 2023-12-13 DIAGNOSIS — E7800 Pure hypercholesterolemia, unspecified: Secondary | ICD-10-CM

## 2023-12-15 MED ORDER — EZETIMIBE 10 MG PO TABS: 10.0000 mg | ORAL_TABLET | Freq: Every day | ORAL | 1 refills | Status: AC

## 2023-12-20 ENCOUNTER — Other Ambulatory Visit: Payer: Self-pay | Admitting: *Deleted

## 2023-12-20 MED ORDER — FOLIC ACID 1 MG PO TABS: 1.0000 mg | ORAL_TABLET | Freq: Every day | ORAL | 3 refills | Status: DC

## 2024-01-07 ENCOUNTER — Encounter: Payer: Self-pay | Admitting: Cardiology

## 2024-02-09 ENCOUNTER — Other Ambulatory Visit: Payer: Self-pay | Admitting: Cardiology

## 2024-02-09 MED ORDER — METOPROLOL SUCCINATE ER 25 MG PO TB24: 25.0000 mg | ORAL_TABLET | Freq: Every day | ORAL | 1 refills | Status: AC

## 2024-03-29 ENCOUNTER — Other Ambulatory Visit: Payer: Self-pay | Admitting: Hematology & Oncology

## 2024-06-01 ENCOUNTER — Inpatient Hospital Stay

## 2024-06-01 ENCOUNTER — Ambulatory Visit: Admitting: Medical Oncology

## 2024-06-20 ENCOUNTER — Inpatient Hospital Stay: Admitting: Medical Oncology

## 2024-06-20 ENCOUNTER — Inpatient Hospital Stay

## 2024-09-27 ENCOUNTER — Ambulatory Visit: Admitting: Cardiology
